# Patient Record
Sex: Male | Born: 1965
Health system: Southern US, Community
[De-identification: ages and names within clinical notes are randomized; demographics above are authoritative.]

## PROBLEM LIST (undated history)

## (undated) DIAGNOSIS — F32A Depression, unspecified: Secondary | ICD-10-CM

## (undated) DIAGNOSIS — F329 Major depressive disorder, single episode, unspecified: Secondary | ICD-10-CM

## (undated) DIAGNOSIS — F1910 Other psychoactive substance abuse, uncomplicated: Secondary | ICD-10-CM

## (undated) DIAGNOSIS — E785 Hyperlipidemia, unspecified: Secondary | ICD-10-CM

## (undated) DIAGNOSIS — F419 Anxiety disorder, unspecified: Secondary | ICD-10-CM

## (undated) HISTORY — DX: Hyperlipidemia, unspecified: E78.5

## (undated) HISTORY — PX: APPENDECTOMY: SHX54

## (undated) HISTORY — PX: EYE SURGERY: SHX253

---

## 1993-07-19 DIAGNOSIS — F1910 Other psychoactive substance abuse, uncomplicated: Secondary | ICD-10-CM

## 1993-07-19 HISTORY — DX: Other psychoactive substance abuse, uncomplicated: F19.10

## 2009-09-22 ENCOUNTER — Ambulatory Visit (HOSPITAL_COMMUNITY): Admission: RE | Admit: 2009-09-22 | Discharge: 2009-09-22 | Payer: Self-pay | Admitting: Orthopedic Surgery

## 2010-07-24 ENCOUNTER — Ambulatory Visit (HOSPITAL_COMMUNITY)
Admission: RE | Admit: 2010-07-24 | Discharge: 2010-07-24 | Payer: Self-pay | Source: Home / Self Care | Attending: Orthopedic Surgery | Admitting: Orthopedic Surgery

## 2011-06-02 DIAGNOSIS — R079 Chest pain, unspecified: Secondary | ICD-10-CM

## 2011-07-05 ENCOUNTER — Emergency Department (HOSPITAL_COMMUNITY)
Admission: EM | Admit: 2011-07-05 | Discharge: 2011-07-06 | Disposition: A | Discharge status: Home / Self Care | Payer: PRIVATE HEALTH INSURANCE | Attending: Emergency Medicine | Admitting: Emergency Medicine

## 2011-07-05 ENCOUNTER — Encounter: Payer: Self-pay | Admitting: Emergency Medicine

## 2011-07-05 DIAGNOSIS — F17200 Nicotine dependence, unspecified, uncomplicated: Secondary | ICD-10-CM | POA: Insufficient documentation

## 2011-07-05 DIAGNOSIS — F411 Generalized anxiety disorder: Secondary | ICD-10-CM

## 2011-07-05 DIAGNOSIS — F419 Anxiety disorder, unspecified: Secondary | ICD-10-CM

## 2011-07-05 HISTORY — DX: Major depressive disorder, single episode, unspecified: F32.9

## 2011-07-05 HISTORY — DX: Other psychoactive substance abuse, uncomplicated: F19.10

## 2011-07-05 HISTORY — DX: Anxiety disorder, unspecified: F41.9

## 2011-07-05 HISTORY — DX: Depression, unspecified: F32.A

## 2011-07-05 LAB — CBC
MCHC: 35.5 g/dL (ref 30.0–36.0)
MCV: 85.9 fL (ref 78.0–100.0)
Platelets: 332 10*3/uL (ref 150–400)

## 2011-07-05 LAB — URINALYSIS, ROUTINE W REFLEX MICROSCOPIC
Bilirubin Urine: NEGATIVE
Ketones, ur: NEGATIVE mg/dL
Leukocytes, UA: NEGATIVE
Urobilinogen, UA: 0.2 mg/dL (ref 0.0–1.0)

## 2011-07-05 LAB — COMPREHENSIVE METABOLIC PANEL
Albumin: 4.3 g/dL (ref 3.5–5.2)
Alkaline Phosphatase: 70 U/L (ref 39–117)
BUN: 12 mg/dL (ref 6–23)
Calcium: 10 mg/dL (ref 8.4–10.5)
GFR calc Af Amer: >90 mL/min (ref >90)
GFR calc non Af Amer: >90 mL/min (ref >90)
Glucose, Bld: 105 mg/dL — ABNORMAL HIGH (ref 70–99)
Potassium: 3.6 mEq/L (ref 3.5–5.1)
Total Bilirubin: 0.3 mg/dL (ref 0.3–1.2)
Total Protein: 7.4 g/dL (ref 6.0–8.3)

## 2011-07-05 LAB — DIFFERENTIAL
Basophils Relative: 0 % (ref 0–1)
Lymphs Abs: 1.9 10*3/uL (ref 0.7–4.0)
Monocytes Absolute: 0.6 10*3/uL (ref 0.1–1.0)
Neutro Abs: 8.8 10*3/uL — ABNORMAL HIGH (ref 1.7–7.7)
Neutrophils Relative %: 77 % (ref 43–77)

## 2011-07-05 LAB — ETHANOL: Alcohol, Ethyl (B): <11 mg/dL (ref 0–11)

## 2011-07-05 LAB — RAPID URINE DRUG SCREEN, HOSP PERFORMED
Benzodiazepines: NOT DETECTED
Tetrahydrocannabinol: NOT DETECTED

## 2011-07-05 MED ORDER — NICOTINE 7 MG/24HR TD PT24
7.0000 mg | MEDICATED_PATCH | Freq: Once | TRANSDERMAL | Status: AC
  Administered 2011-07-05: 7 mg via TRANSDERMAL
  Filled 2011-07-05: qty 1

## 2011-07-05 MED ORDER — LORAZEPAM 1 MG PO TABS
1.0000 mg | ORAL_TABLET | Freq: Once | ORAL | Status: AC
  Administered 2011-07-05: 1 mg via ORAL
  Filled 2011-07-05: qty 1

## 2011-07-05 MED ORDER — QUETIAPINE FUMARATE 50 MG PO TABS
50.0000 mg | ORAL_TABLET | Freq: Every day | ORAL | Status: DC
  Administered 2011-07-05: 50 mg via ORAL
  Filled 2011-07-05: qty 1

## 2011-07-05 MED ORDER — QUETIAPINE FUMARATE 50 MG PO TABS
50.0000 mg | ORAL_TABLET | Freq: Once | ORAL | Status: AC
  Administered 2011-07-05: 50 mg via ORAL
  Filled 2011-07-05: qty 1

## 2011-07-05 MED ORDER — TRAZODONE HCL 100 MG PO TABS
100.0000 mg | ORAL_TABLET | Freq: Every day | ORAL | Status: DC
  Administered 2011-07-05: 100 mg via ORAL
  Filled 2011-07-05: qty 1

## 2011-07-05 MED ORDER — QUETIAPINE FUMARATE 50 MG PO TABS: 50.0000 mg | ORAL_TABLET | Freq: Every day | ORAL | Status: DC

## 2011-07-05 NOTE — ED Notes (Signed)
Two patient belonging bags locked in activity room. 

## 2011-07-05 NOTE — ED Notes (Signed)
PA prescribed Buspar and Xanax in October. Pt also seen by [facility] hospital November for anxiety.  Pt also seen by MD at [facility] and was taken off xanax and changed to Zoloft on Thursday.  Pt reports anxiety improved from October. Still getting sporadic panic attacks and still has difficulty sleeping and trouble eating for over a week. Pt is supposed to see a counselor today. Patient denies SI/HI. Pt says he did not wait for counseling today as something is "wrong."

## 2011-07-05 NOTE — ED Notes (Signed)
Gave patients truck keys to his brother with patients permission.

## 2011-07-05 NOTE — ED Notes (Signed)
Pt c/o dealing with anxiety x 1 week, unable to sleep has been seen and put on buspar and then changed to xanax. And has now started zoloft.  Pt states still not sleeping.

## 2011-07-05 NOTE — ED Provider Notes (Signed)
Medical screening examination/treatment/procedure(s) were performed by non-physician practitioner and as supervising physician I was immediately available for consultation/collaboration.   Grover Woodfield, MD 07/05/11 1511 

## 2011-07-05 NOTE — ED Provider Notes (Signed)
History    this is a generally healthy 45 year old male presenting to the ED with a chief complaint of anxiety. Patient states for the past 2 months he has been having increased stress anxiety. He has trouble sleeping, had been pacing around, and has no appetite. He has multiple panic attacks.  Patient attributed his symptoms to his current job. Patient states, since starting a new job earlier this year he has been having to performed many different task. 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 He denies history of depression, he states he has good family support. He denies having headache, fever, chest pain, shortness of breath, abdominal pain, nausea, vomiting, diarrhea, dysuria. He has been evaluated by his primary care Dr. and has been on several different medications for it.  Initially he was taking BuSpar for 1 month, with no relief in symptoms. He then transitioned to Xanax but just recently discontinued due to his doctor's recommendation. His last Xanax was yesterday. He is currently taking Zoloft and Ambien. He continues to endorse a sensation of anxiety and trouble sleeping. Although he denies homicidal or suicidal thoughts, he is afraid that it may he needs up to it if he doesn't have help. He denies recreational drug use.  CSN: [id number] Arrival date & time: 07/05/2011  7:40 AM   First MD Initiated Contact with Patient 07/05/11 0809      Chief Complaint  Patient presents with  . Anxiety    (Consider location/radiation/quality/duration/timing/severity/associated sxs/prior treatment) HPI  Past Medical History  Diagnosis Date  . Depression   . Drug abuse     Past Surgical History  Procedure Date  . Appendectomy     History reviewed. No pertinent family history.  History  Substance Use Topics  . Smoking status: Current Everyday Smoker -- 1.5 packs/day    Types: Cigarettes  . Smokeless tobacco: Not on file  . Alcohol Use: Yes      Review of Systems    All other systems reviewed and are negative.    Allergies  Review of patient's allergies indicates no known allergies.  Home Medications   Current Outpatient Rx  Name Route Sig Dispense Refill  . VITAMIN B 12 PO Oral Take 1 tablet by mouth daily.      . IBUPROFEN 200 MG PO TABS Oral Take 400 mg by mouth every 6 (six) hours as needed. pain     . SERTRALINE HCL 50 MG PO TABS Oral Take 25 mg by mouth daily. Pt takes 1/2 tab for 50mg  dose     . ZOLPIDEM TARTRATE 10 MG PO TABS Oral Take 10 mg by mouth at bedtime.        BP 135/86  Pulse 94  Temp(Src) 98 F (36.7 C) (Oral)  Resp 16  SpO2 97%  Physical Exam  Nursing note and vitals reviewed. Constitutional: He appears well-developed and well-nourished.       Awake, alert, nontoxic appearance  HENT:  Head: Atraumatic.  Mouth/Throat: Oropharynx is clear and moist.  Eyes: EOM are normal. Pupils are equal, round, and reactive to light. Right eye exhibits no discharge. Left eye exhibits no discharge.  Neck: Neck supple.  Cardiovascular: Normal rate and regular rhythm.   Pulmonary/Chest: Effort normal. He exhibits no tenderness.  Abdominal: Soft. Bowel sounds are normal. There is no tenderness. There is no rebound.  Musculoskeletal: He exhibits no tenderness.       Baseline ROM, no obvious new focal weakness  Neurological:  Mental status and motor strength appears baseline for patient and situation  Skin: No rash noted.  Psychiatric: He has a normal mood and affect.    ED Course  Procedures (including critical care time)  Labs Reviewed - No data to display No results found.   No diagnosis found.    MDM  This is a generally healthy patient, presenting with increased anxiety. This is likely related to a stressful job. I doubt a medical correlation to his symptoms. He may benefit from further outpatient management, including thyroid and cortisol check, which is not normally done in the ED. Patient requests for further  management, and he is worried of hurting himself if he does get discharge, therefore i will consult with Behavioral Health for further management.     9:36 AM I have consulted with Behavioral Health ACT Team, who agrees to see pt in ED.  Pt currently resting comfortably after administration of ativan 1mg  PO.  Pt is medically cleared.  He will benefit for outpatient follow up.    Fayrene Helper, PA 07/05/11 1339

## 2011-07-05 NOTE — Consult Note (Signed)
Patient Identification:  Benjamin Zamora Date of Evaluation:  07/05/2011   History of Present Illness:  45 year old male presenting to the ED with a chief complaint of anxiety. Patient states for the past 2 months he has been having increased stress anxiety. He has trouble sleeping, had been pacing around, and has no appetite. He has multiple panic attacks. Patient attributed his symptoms to his current job. Patient states, since starting a new job earlier this year he has been having to performed many different task. 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 He denies history of depression, he states he has good family support. He denies having headache, fever, chest pain, shortness of breath, abdominal pain, nausea, vomiting, diarrhea, dysuria. He has been evaluated by his primary care Dr. and has been on several different medications for it. Initially he was taking BuSpar for 1 month, with no relief in symptoms. He then transitioned to Xanax but just recently discontinued due to his doctor's recommendation. His last Xanax was yesterday. He is currently taking Zoloft and Ambien. He continues to endorse a sensation of anxiety and trouble sleeping. Although he denies homicidal or suicidal thoughts, he is afraid that it may he needs up to it if he doesn't have help. He denies recreational drug use.   Patient has to take a number of medications in the past like Xanax Ambien Vistaril temazepam but nothing has helped him up to now. I discussed with him about Seroquel that it will help him both for insomnia and anxiety patient agreed to take this medication. Side effects and benefits of the medications discussed with the patient.   Mental Status Examination: She is logical and goal-directed during interview not hallucinating or delusional. Not suicidal or homicidal. Alert awake oriented x3 insight and judgment fair attention concentration good memory good   Past Medical History:       Past Medical History  Diagnosis Date  . Depression   . Drug abuse        Past Surgical History  Procedure Date  . Appendectomy     Filed Vitals:   07/05/11 1327  BP: 132/83  Pulse: 98  Temp: 98.2 F (36.8 C)  Resp: 18    Lab Results:   BMET    Component Value Date/Time   NA 137 07/05/2011 0915   K 3.6 07/05/2011 0915   CL 101 07/05/2011 0915   CO2 26 07/05/2011 0915   GLUCOSE 105* 07/05/2011 0915   BUN 12 07/05/2011 0915   CREATININE 0.84 07/05/2011 0915   CALCIUM 10.0 07/05/2011 0915   GFRNONAA >90 07/05/2011 0915   GFRAA >90 07/05/2011 0915    Allergies: No Known Allergies  Current Medications:  Prior to Admission medications   Medication Sig Start Date End Date Taking? Authorizing Provider  Cyanocobalamin (VITAMIN B 12 PO) Take 1 tablet by mouth daily.     Yes Historical Provider, MD  ibuprofen (ADVIL,MOTRIN) 200 MG tablet Take 400 mg by mouth every 6 (six) hours as needed. pain    Yes Historical Provider, MD  sertraline (ZOLOFT) 50 MG tablet Take 25 mg by mouth daily. Pt takes 1/2 tab for 50mg  dose    Yes Historical Provider, MD  zolpidem (AMBIEN) 10 MG tablet Take 10 mg by mouth at bedtime.     Yes Historical Provider, MD    Social History:    reports that he has been smoking Cigarettes.  He has been smoking about 1.5 packs per day. He does not  have any smokeless tobacco history on file. He reports that he drinks alcohol. His drug history not on file.   Family History:    History reviewed. No pertinent family history.   DIAGNOSIS:   AXIS I  panic disorder, depressive disorder NOS   AXIS II  Deffered  AXIS III See medical notes.  AXIS IV  stress at job unable to sleep   AXIS V 50     Recommendations:  Patient started on Seroquel and trazodone and will be observed in the ER.   Eulogio Ditch, MD

## 2011-07-05 NOTE — ED Notes (Signed)
Denies si/hi at this time 

## 2011-07-05 NOTE — ED Notes (Signed)
Patient resting quietly.  sts that he dosen't need anything except a drink of water, that was brought to him

## 2011-07-05 NOTE — BH Assessment (Signed)
Assessment Note   Benjamin Zamora is an 45 y.o. male. Pt here today with a complaint of insomnia, decreased appetite, and  anxiety.  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 Pt is married and has three kids. Pt has a  4 yrs old unemployed daughter expecting a baby and feels that he will have to take care of this baby. Pt attempted to use exercise and listen to music to decreased his stress level, but nothing improves. Pt reports his biggest stressor is that he is going to get sick from not sleeping or not eating and scared that something bad could happen if he could not get help for his anxiety.Sts he has not slept in approx. 1 week. He denies SI, HI, and AVH. However, he sts "I'm scared that If I don't sleep for one more night.Marland KitchenMarland KitchenI may start feeling depressed or suicidal". Pt insures that he is in no way suicidal at this time. Pt denies alcohol and drug use. He admits that approx. 15 yrs ago he was admitted at [facility] for [facility] use and depression.    Axis I: Anxiety Disorder NOS Axis II: Deferred Axis III: Insomnia Past Medical History  Diagnosis Date  . Depression   . Drug abuse   . Anxiety    Axis IV: occupational problems, other psychosocial or environmental problems and problems related to social environment Axis V: 41-50 serious symptoms  Past Medical History:  Past Medical History  Diagnosis Date  . Depression   . Drug abuse   . Anxiety     Past Surgical History  Procedure Date  . Appendectomy     Family History: History reviewed. No pertinent family history.  Social History:  reports that he has been smoking Cigarettes.  He has been smoking about 1.5 packs per day. He does not have any smokeless tobacco history on file. He reports that he drinks alcohol. His drug history not on file.  Additional Social History:  Alcohol / Drug Use Pain Medications: none reported Prescriptions:  Cyanocobalamin (Vitamin B-12 PO), Ibuprofen (Advil,motrin), Setraline (Zoloft), Zopidem (ambien) Over the Counter: none reported History of alcohol / drug use?: Yes Longest period of sobriety (when/how long): 15 yrs of sobriety from [facility], per patient Substance #1 Name of Substance 1: THC 1 - Age of First Use: n/a 1 - Amount (size/oz): n/a 1 - Frequency: n/a 1 - Duration: n/a 1 - Last Use / Amount: 15 yrs ago Allergies: No Known Allergies  Home Medications:  Medications Prior to Admission  Medication Dose Route Frequency Provider Last Rate Last Dose  . LORazepam (ATIVAN) tablet 1 mg  1 mg Oral Once Fayrene Helper, PA   1 mg at 07/05/11 0913  . nicotine (NICODERM CQ - dosed in mg/24 hr) patch 7 mg  7 mg Transdermal Once Fayrene Helper, PA   7 mg at 07/05/11 0948  . QUEtiapine (SEROQUEL) tablet 50 mg  50 mg Oral Once Katheren Puller, MD   50 mg at 07/05/11 1506  . QUEtiapine (SEROQUEL) tablet 50 mg  50 mg Oral QHS Shamsher S Ahluwalia, MD      . traZODone (DESYREL) tablet 100 mg  100 mg Oral QHS Shamsher Alvie Heidelberg, MD      . DISCONTD: QUEtiapine (SEROQUEL) tablet 50 mg  50 mg Oral QHS Katheren Puller, MD       No current outpatient prescriptions on file as of 07/05/2011.  OB/GYN Status:  No LMP for male patient.  General Assessment Data Location of Assessment: [facility] ED ACT Assessment:  (none reported) Living Arrangements: Spouse/significant other;Children (3 children in home (ages 82,15,21)) Can pt return to current living arrangement?: Yes Admission Status: Voluntary Is patient capable of signing voluntary admission?: Yes Transfer from: Acute Hospital Referral Source: Self/Family/Friend  Education Status Is patient currently in school?: No Current Grade:  (n/a) Highest grade of school patient has completed:  (n/a) Name of school: n/a Contact person: n/a  Risk to self Suicidal Ideation: No Suicidal Intent: No Is patient at risk for suicide?: No Suicidal Plan?:  No Access to Means: No What has been your use of drugs/alcohol within the last 12 months?:  (n/a) Previous Attempts/Gestures: No How many times?:  (n/a) Other Self Harm Risks:  (n/a) Triggers for Past Attempts: Other (Comment) (n/a) Intentional Self Injurious Behavior: None Family Suicide History: No Recent stressful life event(s): Other (Comment) (new/demanding job,3kids,21 y/o child preg.,not able to sleep) Persecutory voices/beliefs?: No Depression: Yes Depression Symptoms: Insomnia Substance abuse history and/or treatment for substance abuse?: No Suicide prevention information given to non-admitted patients: Not applicable  Risk to Others Homicidal Ideation: No Thoughts of Harm to Others: No Current Homicidal Intent: No Current Homicidal Plan: No Access to Homicidal Means: No Identified Victim:  (n/a) History of harm to others?: No Assessment of Violence: None Noted Violent Behavior Description:  (none noted) Does patient have access to weapons?: No Criminal Charges Pending?: No Does patient have a court date: No  Psychosis Hallucinations: None noted Delusions: None noted  Mental Status Report Appear/Hygiene: Other (Comment) (neat) Eye Contact: Good Motor Activity: Other (Comment);Unremarkable (normal) Speech: Logical/coherent Level of Consciousness: Alert Mood: Depressed;Anxious;Helpless Affect: Unable to Assess;Anxious Anxiety Level: Severe Thought Processes: Relevant Judgement: Unimpaired Orientation: Person;Time;Place;Situation Obsessive Compulsive Thoughts/Behaviors: None  Cognitive Functioning Concentration: Normal Memory: Recent Intact;Remote Intact IQ: Average Insight: Poor Impulse Control: Good Appetite: Poor Weight Loss:  (no wt. loss reported) Weight Gain:  (no wt. gain reported) Sleep: Decreased Total Hours of Sleep:  (0) Vegetative Symptoms: None  Prior Inpatient Therapy Prior Inpatient Therapy: Yes Prior Therapy Dates:  (15 yrs  ago) Prior Therapy Facilty/Provider(s):  [facility]) Reason for Treatment:  (depression and substance abuse treatment)  Prior Outpatient Therapy Prior Outpatient Therapy: No Prior Therapy Dates:  (going to see psychiatrist for 1rst x but came to ED today) Prior Therapy Facilty/Provider(s):  (no hx treatment) Reason for Treatment:  (no prior treatment)  ADL Screening (condition at time of admission) Independently performs ADLs?: No Communication: Independent Dressing (OT): Independent Grooming: Independent Feeding: Independent Bathing: Independent Toileting: Independent In/Out Bed: Independent Walks in Home: Independent  Home Assistive Devices/Equipment Home Assistive Devices/Equipment: None      Values / Beliefs Cultural Requests During Hospitalization: None Spiritual Requests During Hospitalization: None   Advance Directives (For Healthcare) Advance Directive: Patient does not have advance directive Nutrition Screen Diet: Regular Unintentional weight loss greater than 10lbs within the last month: No (no wt. loss, however; pt reports poor appetite) Dysphagia: No Home Tube Feeding or Total Parenteral Nutrition (TPN): No Patient appears severely malnourished: No Pregnant or Lactating: No Dietitian Consult Needed: No  Additional Information 1:1 In Past 12 Months?: No CIRT Risk: No Elopement Risk: No Does patient have medical clearance?: Yes     Disposition:  Disposition Disposition of Patient: Other dispositions (remain in the ED for observation & med mgt;re-assess in am) Other disposition(s): Information only  Per recommendations from Dr. Rogers Blocker: Patient started on Seroquel and  trazodone and will be observed in the ER.   On Site Evaluation by:   Reviewed with Physician:     Melynda Ripple [facility] 07/05/2011 3:55 PM

## 2011-07-05 NOTE — ED Notes (Signed)
2 personal belongings bags locked up in the Equipment Room on TCU.

## 2011-07-05 NOTE — ED Notes (Signed)
Patient changed into paper scrubs and security called to wand

## 2011-07-05 NOTE — ED Notes (Signed)
Pt reports trigger has been starting a new job that has increased responsibility and stress level. 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 Married and has three kids so added responsibility there as well.  Pt also has a child expecting a child that was unexpected and an additional stressor in his life.   Pt attempted to use exercise and listen to music to decreased his stress level, but nothing improves. Pt reports his biggest stressor is that he is going to get sick from not sleeping or not eating and scared that something bad could happen if he could not get help for his anxiety.

## 2011-07-05 NOTE — ED Notes (Signed)
Pt reports he does not feel comfortable going home and says "something is wrong, I just know something is wrong."

## 2011-07-05 NOTE — ED Notes (Signed)
Pt has history of drug addiction 15 years ago. Pt concerned the xanax would trigger his addiction again. Pt reports being clean for 15 years.

## 2011-07-06 MED ORDER — TRAZODONE HCL 100 MG PO TABS: 100.0000 mg | ORAL_TABLET | Freq: Every day | ORAL | Status: AC

## 2011-07-06 MED ORDER — QUETIAPINE FUMARATE 50 MG PO TABS: 50.0000 mg | ORAL_TABLET | Freq: Every day | ORAL | Status: AC

## 2011-07-06 NOTE — ED Notes (Signed)
Slept soundly through the night, no c/o voiced

## 2011-07-06 NOTE — Consult Note (Signed)
  Benjamin Zamora is a 45 y.o. male [id number] March 01, 1966  Subjective/Objective:  Patient is doing much better. He slept good last night. No side effects reported from the medications. Patient is not suicidal or homicidal not hallucinating or delusional alert to recurrent at x3 insight and judgment intact patient has a good family support and is motivated to followup in the outpatient setting   Filed Vitals:   07/06/11 0615  BP: 126/88  Pulse: 80  Temp: 98.3 F (36.8 C)  Resp: 18    Lab Results:   BMET    Component Value Date/Time   NA 137 07/05/2011 0915   K 3.6 07/05/2011 0915   CL 101 07/05/2011 0915   CO2 26 07/05/2011 0915   GLUCOSE 105* 07/05/2011 0915   BUN 12 07/05/2011 0915   CREATININE 0.84 07/05/2011 0915   CALCIUM 10.0 07/05/2011 0915   GFRNONAA >90 07/05/2011 0915   GFRAA >90 07/05/2011 0915    Medications:  Scheduled:     . nicotine  7 mg Transdermal Once  . QUEtiapine  50 mg Oral Once  . QUEtiapine  50 mg Oral QHS  . traZODone  100 mg Oral QHS  . DISCONTD: QUEtiapine  50 mg Oral QHS     PRN Meds   Assessment/Plan:  Patient can be discharged I gave him a one-month supply of medications. AHLUWALIA,SHAMSHER S MD 07/06/2011

## 2011-07-06 NOTE — ED Notes (Signed)
Amended note--Pt instructed to stop zoloft and ambien VORB Dr Laymond Purser

## 2011-07-06 NOTE — ED Notes (Signed)
Belongings returned

## 2013-02-12 ENCOUNTER — Encounter: Payer: Self-pay | Admitting: *Deleted

## 2013-02-12 NOTE — Telephone Encounter (Signed)
This encounter was created in error - please disregard.

## 2013-02-13 ENCOUNTER — Encounter: Payer: Self-pay | Admitting: Family Medicine

## 2013-02-13 ENCOUNTER — Ambulatory Visit (INDEPENDENT_AMBULATORY_CARE_PROVIDER_SITE_OTHER): Payer: BC Managed Care – PPO | Admitting: Family Medicine

## 2013-02-13 VITALS — BP 132/88 | HR 72 | Temp 97.6°F | Wt 185.0 lb

## 2013-02-13 DIAGNOSIS — F3289 Other specified depressive episodes: Secondary | ICD-10-CM

## 2013-02-13 DIAGNOSIS — F32A Depression, unspecified: Secondary | ICD-10-CM

## 2013-02-13 DIAGNOSIS — G4700 Insomnia, unspecified: Secondary | ICD-10-CM

## 2013-02-13 DIAGNOSIS — F329 Major depressive disorder, single episode, unspecified: Secondary | ICD-10-CM

## 2013-02-13 MED ORDER — ESCITALOPRAM OXALATE 10 MG PO TABS: 10.0000 mg | ORAL_TABLET | Freq: Every day | ORAL | Status: DC

## 2013-02-13 MED ORDER — SILDENAFIL CITRATE 100 MG PO TABS: 100.0000 mg | ORAL_TABLET | Freq: Every day | ORAL | Status: DC | PRN

## 2013-02-13 MED ORDER — CLONAZEPAM 1 MG PO TABS: 1.0000 mg | ORAL_TABLET | Freq: Two times a day (BID) | ORAL | Status: DC | PRN

## 2013-02-13 NOTE — Patient Instructions (Signed)

## 2013-02-13 NOTE — Progress Notes (Signed)
  Subjective:    Patient ID: Benjamin Zamora, male    DOB: Oct 07, 1965, 47 y.o.   MRN: [id number]  HPI This 47 y.o. male presents for evaluation of depression and insomnia.  He has Hx of depression and takes lexapro which helps.  He has hx of  Insomnia and Clonazepam helps very well.  He denies any SI/HI and is very busy with his Job with [employer].    Review of Systems Vital signs noted  Well developed well nourished male.  HEENT - Head atraumatic Normocephalic                Eyes - PERRLA, Conjuctiva - clear Sclera- Clear EOMI                Ears - EAC's Wnl TM's Wnl Gross Hearing WNL                Nose - Nares patent                 Throat - oropharanx wnl Respiratory - Lungs CTA bilateral Cardiac - RRR S1 and S2 without murmur GI - Abdomen soft Nontender and bowel sounds active x 4 Extremities - No edema. Neuro - Grossly intact.    Objective:   Physical Exam Vital signs noted  Well developed well nourished male.  HEENT - Head atraumatic Normocephalic                Eyes - PERRLA, Conjuctiva - clear Sclera- Clear EOMI                Ears - EAC's Wnl TM's Wnl Gross Hearing WNL                Nose - Nares patent                 Throat - oropharanx wnl Respiratory - Lungs CTA bilateral Cardiac - RRR S1 and S2 without murmur GI - Abdomen soft Nontender and bowel sounds active x 4 Extremities - No edema. Neuro - Grossly intact.       Assessment & Plan:  Depression - Plan: escitalopram (LEXAPRO) 10 MG tablet, clonazePAM (KLONOPIN) 1 MG tablet  Insomnia - Discussed sleep hygiene and discussed taking holiday with clonazepam to help it work Better for insomnia tx.

## 2013-05-25 ENCOUNTER — Ambulatory Visit (INDEPENDENT_AMBULATORY_CARE_PROVIDER_SITE_OTHER): Payer: BC Managed Care – PPO | Admitting: Family Medicine

## 2013-05-25 ENCOUNTER — Ambulatory Visit (INDEPENDENT_AMBULATORY_CARE_PROVIDER_SITE_OTHER): Payer: BC Managed Care – PPO

## 2013-05-25 ENCOUNTER — Encounter: Payer: Self-pay | Admitting: Family Medicine

## 2013-05-25 VITALS — BP 145/91 | HR 90 | Temp 96.9°F | Ht 72.0 in | Wt 187.0 lb

## 2013-05-25 DIAGNOSIS — M25562 Pain in left knee: Secondary | ICD-10-CM

## 2013-05-25 DIAGNOSIS — F32A Depression, unspecified: Secondary | ICD-10-CM

## 2013-05-25 DIAGNOSIS — F3289 Other specified depressive episodes: Secondary | ICD-10-CM

## 2013-05-25 DIAGNOSIS — M25569 Pain in unspecified knee: Secondary | ICD-10-CM

## 2013-05-25 DIAGNOSIS — F329 Major depressive disorder, single episode, unspecified: Secondary | ICD-10-CM

## 2013-05-25 MED ORDER — IBUPROFEN 600 MG PO TABS: 600.0000 mg | ORAL_TABLET | Freq: Three times a day (TID) | ORAL | Status: DC | PRN

## 2013-05-25 MED ORDER — CLONAZEPAM 1 MG PO TABS: 1.0000 mg | ORAL_TABLET | Freq: Two times a day (BID) | ORAL | Status: DC | PRN

## 2013-05-25 MED ORDER — HYDROCODONE-ACETAMINOPHEN 5-325 MG PO TABS: 1.0000 | ORAL_TABLET | Freq: Four times a day (QID) | ORAL | Status: DC | PRN

## 2013-05-25 NOTE — Progress Notes (Signed)
  Subjective:    Patient ID: Benjamin Zamora, male    DOB: 12-May-1966, 47 y.o.   MRN: [id number]  HPI This 47 y.o. male presents for evaluation of knee pian for 2 months.  He has been Having left back that has been radiating down his left leg.  He has been having leg Pain at night and it has been severe.  He has been using icy hot and it still bothers Him..   Review of Systems C/o right knee pain and anxiety No chest pain, SOB, HA, dizziness, vision change, N/V, diarrhea, constipation, dysuria, urinary urge or rash.     Objective:   Physical Exam Vital signs noted  Well developed well nourished male.  HEENT - Head atraumatic Normocephalic                Eyes - PERRLA, Conjuctiva - clear Sclera- Clear EOMI                Ears - EAC's Wnl TM's Wnl Gross Hearing WNL                Nose - Nares patent                 Throat - oropharanx wnl Respiratory - Lungs CTA bilateral Cardiac - RRR S1 and S2 without murmur GI - Abdomen soft Nontender and bowel sounds active x 4 Extremities - No edema. Neuro - Grossly intact. MS - TTP right knee.        Assessment & Plan:  Knee pain, acute, left - Plan: DG Knee 1-2 Views Left, ibuprofen (ADVIL,MOTRIN) 600 MG tablet, HYDROcodone-acetaminophen (NORCO) 5-325 MG per tablet  Depression - Plan: clonazePAM (KLONOPIN) 1 MG tablet, DISCONTINUED: clonazePAM (KLONOPIN) 1 MG tablet  Follow up in 3 months and prn.  Deatra Canter FNP

## 2013-05-25 NOTE — Patient Instructions (Signed)
Patellofemoral Pain  Your exam shows your knee pain is probably due to a problem with the knee cap, the patella. This problem is also called patellofemoral pain, runner's knee, or chondromalacia. Most of the time, this problem is due to overuse of the knee joint. Repeated bending and straightening can irritate the underside of the knee cap. When this happens, activities such as running, walking, climbing, biking or jumping usually produce pain. Pain may also occur after prolonged sitting. Other patellofemoral symptoms can include joint stiffness, swelling, and a snapping or grinding sensation with movement. Rest and rehabilitation are usually successful in treating this problem. Surgery is rarely needed.  Treatment includes correcting any mechanical factors that could hurt the normal working of the knee. This could be weak thigh muscles or foot problems. Avoid repetitive activities of the knee until the pain and other symptoms improve. Apply ice packs over the knee for 20 to 30 minutes every 2 to 4 hours to reduce pain and swelling. Only take over-the-counter or prescription medicines for pain, discomfort, or fever as directed by your caregiver. Knee braces or neoprene sleeves may help reduce irritation. Rehabilitation exercises to strengthen the quad muscle are often prescribed when your symptoms are better. Call your caregiver for a follow-up exam to evaluate your response to treatment.  Document Released: 08/12/2004 Document Revised: 09/27/2011 Document Reviewed: 07/05/2005  ExitCare® Patient Information ©2014 [employer].

## 2013-06-18 ENCOUNTER — Ambulatory Visit: Payer: BC Managed Care – PPO | Admitting: Family Medicine

## 2013-12-13 ENCOUNTER — Ambulatory Visit (INDEPENDENT_AMBULATORY_CARE_PROVIDER_SITE_OTHER): Payer: BC Managed Care – PPO | Admitting: Family Medicine

## 2013-12-13 VITALS — BP 117/79 | HR 85 | Temp 98.6°F | Wt 187.4 lb

## 2013-12-13 DIAGNOSIS — F329 Major depressive disorder, single episode, unspecified: Secondary | ICD-10-CM

## 2013-12-13 DIAGNOSIS — F32A Depression, unspecified: Secondary | ICD-10-CM

## 2013-12-13 DIAGNOSIS — J329 Chronic sinusitis, unspecified: Secondary | ICD-10-CM

## 2013-12-13 DIAGNOSIS — N529 Male erectile dysfunction, unspecified: Secondary | ICD-10-CM

## 2013-12-13 DIAGNOSIS — F3289 Other specified depressive episodes: Secondary | ICD-10-CM

## 2013-12-13 MED ORDER — ESCITALOPRAM OXALATE 10 MG PO TABS: 10.0000 mg | ORAL_TABLET | Freq: Every day | ORAL | Status: DC

## 2013-12-13 MED ORDER — AMOXICILLIN 875 MG PO TABS: 875.0000 mg | ORAL_TABLET | Freq: Two times a day (BID) | ORAL | Status: DC

## 2013-12-13 MED ORDER — SILDENAFIL CITRATE 100 MG PO TABS: 100.0000 mg | ORAL_TABLET | Freq: Every day | ORAL | Status: DC | PRN

## 2013-12-13 MED ORDER — CLONAZEPAM 1 MG PO TABS: 1.0000 mg | ORAL_TABLET | Freq: Two times a day (BID) | ORAL | Status: DC | PRN

## 2013-12-13 NOTE — Progress Notes (Signed)
   Subjective:    Patient ID: Demondre Aguas, male    DOB: [birth year], 48 y.o.   MRN: [id number]  HPI  This 48 y.o. male presents for evaluation of uri sx's.  He needs refills on his depression and insomnia meds.  Review of Systems No chest pain, SOB, HA, dizziness, vision change, N/V, diarrhea, constipation, dysuria, urinary urgency or frequency, myalgias, arthralgias or rash.     Objective:   Physical Exam  Vital signs noted  Well developed well nourished male.  HEENT - Head atraumatic Normocephalic                Eyes - PERRLA, Conjuctiva - clear Sclera- Clear EOMI                Ears - EAC's Wnl TM's Wnl Gross Hearing WNL                Nose - Nares patent                 Throat - oropharanx wnl Respiratory - Lungs CTA bilateral Cardiac - RRR S1 and S2 without murmur GI - Abdomen soft Nontender and bowel sounds active x 4 Extremities - No edema. Neuro - Grossly intact.      Assessment & Plan:  Sinusitis - Plan: amoxicillin (AMOXIL) 875 MG tablet  Depression - Plan: clonazePAM (KLONOPIN) 1 MG tablet, escitalopram (LEXAPRO) 10 MG tablet  ED (erectile dysfunction) - Plan: sildenafil (VIAGRA) 100 MG tablet  Lysbeth Penner FNP

## 2014-07-10 ENCOUNTER — Other Ambulatory Visit: Payer: Self-pay | Admitting: Family Medicine

## 2014-07-10 NOTE — Telephone Encounter (Signed)
Last seen 12/13/13 [place]  If approved route to nurse to call into [facility]

## 2014-07-17 ENCOUNTER — Other Ambulatory Visit: Payer: Self-pay | Admitting: Family Medicine

## 2014-07-26 ENCOUNTER — Other Ambulatory Visit: Payer: Self-pay | Admitting: Family Medicine

## 2014-07-26 DIAGNOSIS — F32A Depression, unspecified: Secondary | ICD-10-CM

## 2014-07-26 DIAGNOSIS — F329 Major depressive disorder, single episode, unspecified: Secondary | ICD-10-CM

## 2014-07-26 MED ORDER — CLONAZEPAM 1 MG PO TABS: 1.0000 mg | ORAL_TABLET | Freq: Two times a day (BID) | ORAL | Status: DC | PRN

## 2014-12-02 ENCOUNTER — Other Ambulatory Visit: Payer: Self-pay | Admitting: Family Medicine

## 2014-12-11 ENCOUNTER — Encounter: Payer: Self-pay | Admitting: Family

## 2014-12-11 ENCOUNTER — Ambulatory Visit (INDEPENDENT_AMBULATORY_CARE_PROVIDER_SITE_OTHER): Payer: BLUE CROSS/BLUE SHIELD | Admitting: Family

## 2014-12-11 VITALS — BP 131/89 | HR 85 | Temp 98.3°F | Ht 72.0 in | Wt 187.0 lb

## 2014-12-11 DIAGNOSIS — F329 Major depressive disorder, single episode, unspecified: Secondary | ICD-10-CM

## 2014-12-11 DIAGNOSIS — F32A Depression, unspecified: Secondary | ICD-10-CM | POA: Insufficient documentation

## 2014-12-11 DIAGNOSIS — F411 Generalized anxiety disorder: Secondary | ICD-10-CM | POA: Diagnosis not present

## 2014-12-11 DIAGNOSIS — N529 Male erectile dysfunction, unspecified: Secondary | ICD-10-CM | POA: Diagnosis not present

## 2014-12-11 MED ORDER — SILDENAFIL CITRATE 100 MG PO TABS: 100.0000 mg | ORAL_TABLET | Freq: Every day | ORAL | Status: DC | PRN

## 2014-12-11 MED ORDER — ESCITALOPRAM OXALATE 10 MG PO TABS: 10.0000 mg | ORAL_TABLET | Freq: Every day | ORAL | Status: DC

## 2014-12-11 MED ORDER — CLONAZEPAM 1 MG PO TABS: 1.0000 mg | ORAL_TABLET | Freq: Two times a day (BID) | ORAL | Status: DC | PRN

## 2014-12-11 NOTE — Progress Notes (Signed)
   Subjective:    Patient ID: Benjamin Zamora, male    DOB: 07/05/1966, 48 y.o.   MRN: [id number]  Anxiety Presents for follow-up visit. Onset was 1 to 6 months ago. The problem has been waxing and waning. Patient reports no decreased concentration, depressed mood, excessive worry, insomnia, irritability, nervous/anxious behavior or palpitations. Symptoms occur occasionally. The symptoms are aggravated by family issues. The quality of sleep is good.   His past medical history is significant for anxiety/panic attacks and depression. Past treatments include SSRIs and benzodiazephines. The treatment provided significant relief. Compliance with prior treatments has been good.      Review of Systems  Constitutional: Negative.  Negative for irritability.  HENT: Negative.   Respiratory: Negative.   Cardiovascular: Negative.  Negative for palpitations.  Gastrointestinal: Negative.   Endocrine: Negative.   Genitourinary: Negative.   Musculoskeletal: Negative.   Neurological: Negative.   Hematological: Negative.   Psychiatric/Behavioral: Negative.  Negative for decreased concentration. The patient is not nervous/anxious and does not have insomnia.   All other systems reviewed and are negative.      Objective:   Physical Exam  Constitutional: He is oriented to person, place, and time. He appears well-developed and well-nourished. No distress.  HENT:  Head: Normocephalic.  Right Ear: External ear normal.  Left Ear: External ear normal.  Nose: Nose normal.  Mouth/Throat: Oropharynx is clear and moist.  Eyes: Pupils are equal, round, and reactive to light. Right eye exhibits no discharge. Left eye exhibits no discharge.  Neck: Normal range of motion. Neck supple. No thyromegaly present.  Cardiovascular: Normal rate, regular rhythm, normal heart sounds and intact distal pulses.   No murmur heard. Pulmonary/Chest: Effort normal and breath sounds normal. No respiratory distress. He has no  wheezes.  Abdominal: Soft. Bowel sounds are normal. He exhibits no distension. There is no tenderness.  Musculoskeletal: Normal range of motion. He exhibits no edema or tenderness.  Neurological: He is alert and oriented to person, place, and time. He has normal reflexes. No cranial nerve deficit.  Skin: Skin is warm and dry. No rash noted. No erythema.  Psychiatric: He has a normal mood and affect. His behavior is normal. Judgment and thought content normal.  Vitals reviewed.   BP 131/89 mmHg  Pulse 85  Temp(Src) 98.3 F (36.8 C) (Oral)  Ht 6' (1.829 m)  Wt 187 lb (84.823 kg)  BMI 25.36 kg/m2       Assessment & Plan:  1. GAD (generalized anxiety disorder) -Stress management discussed - escitalopram (LEXAPRO) 10 MG tablet; Take 1 tablet (10 mg total) by mouth daily.  Dispense: 90 tablet; Refill: 3 - clonazePAM (KLONOPIN) 1 MG tablet; Take 1 tablet (1 mg total) by mouth 2 (two) times daily as needed for anxiety.  Dispense: 30 tablet; Refill: 3 - CMP14+EGFR  2. Depression -Stress management discussed - escitalopram (LEXAPRO) 10 MG tablet; Take 1 tablet (10 mg total) by mouth daily.  Dispense: 90 tablet; Refill: 3 - clonazePAM (KLONOPIN) 1 MG tablet; Take 1 tablet (1 mg total) by mouth 2 (two) times daily as needed for anxiety.  Dispense: 30 tablet; Refill: 3 - CMP14+EGFR   Continue all meds Labs pending Health Maintenance reviewed Diet and exercise encouraged RTO 6 months   , FNP   

## 2014-12-11 NOTE — Patient Instructions (Signed)
Generalized Anxiety Disorder Generalized anxiety disorder (GAD) is a mental disorder. It interferes with life functions, including relationships, work, and school. GAD is different from normal anxiety, which everyone experiences at some point in their lives in response to specific life events and activities. Normal anxiety actually helps us prepare for and get through these life events and activities. Normal anxiety goes away after the event or activity is over.  GAD causes anxiety that is not necessarily related to specific events or activities. It also causes excess anxiety in proportion to specific events or activities. The anxiety associated with GAD is also difficult to control. GAD can vary from mild to severe. People with severe GAD can have intense waves of anxiety with physical symptoms (panic attacks).  SYMPTOMS The anxiety and worry associated with GAD are difficult to control. This anxiety and worry are related to many life events and activities and also occur more days than not for 6 months or longer. People with GAD also have three or more of the following symptoms (one or more in children):  Restlessness.   Fatigue.  Difficulty concentrating.   Irritability.  Muscle tension.  Difficulty sleeping or unsatisfying sleep. DIAGNOSIS GAD is diagnosed through an assessment by your health care provider. Your health care provider will ask you questions aboutyour mood,physical symptoms, and events in your life. Your health care provider may ask you about your medical history and use of alcohol or drugs, including prescription medicines. Your health care provider may also do a physical exam and blood tests. Certain medical conditions and the use of certain substances can cause symptoms similar to those associated with GAD. Your health care provider may refer you to a mental health specialist for further evaluation. TREATMENT The following therapies are usually used to treat GAD:    Medication. Antidepressant medication usually is prescribed for long-term daily control. Antianxiety medicines may be added in severe cases, especially when panic attacks occur.   Talk therapy (psychotherapy). Certain types of talk therapy can be helpful in treating GAD by providing support, education, and guidance. A form of talk therapy called cognitive behavioral therapy can teach you healthy ways to think about and react to daily life events and activities.  Stress managementtechniques. These include yoga, meditation, and exercise and can be very helpful when they are practiced regularly. A mental health specialist can help determine which treatment is best for you. Some people see improvement with one therapy. However, other people require a combination of therapies. Document Released: 10/30/2012 Document Revised: 11/19/2013 Document Reviewed: 10/30/2012 ExitCare Patient Information 2015 [employer]. This information is not intended to replace advice given to you by your health care provider. Make sure you discuss any questions you have with your health care provider.  

## 2014-12-12 LAB — CMP14+EGFR
ALBUMIN: 4.3 g/dL (ref 3.5–5.5)
ALT: 15 IU/L (ref 0–44)
AST: 19 IU/L (ref 0–40)
Albumin/Globulin Ratio: 1.7 (ref 1.1–2.5)
Alkaline Phosphatase: 71 IU/L (ref 39–117)
BUN/Creatinine Ratio: 16 (ref 9–20)
BUN: 14 mg/dL (ref 6–24)
Bilirubin Total: <0.2 mg/dL (ref 0.0–1.2)
CHLORIDE: 101 mmol/L (ref 97–108)
CO2: 22 mmol/L (ref 18–29)
Calcium: 9.4 mg/dL (ref 8.7–10.2)
Creatinine, Ser: 0.87 mg/dL (ref 0.76–1.27)
GFR, EST AFRICAN AMERICAN: 117 mL/min/{1.73_m2} (ref >59)
GFR, EST NON AFRICAN AMERICAN: 101 mL/min/{1.73_m2} (ref >59)
GLOBULIN, TOTAL: 2.5 g/dL (ref 1.5–4.5)
GLUCOSE: 99 mg/dL (ref 65–99)
Potassium: 4 mmol/L (ref 3.5–5.2)
SODIUM: 140 mmol/L (ref 134–144)
Total Protein: 6.8 g/dL (ref 6.0–8.5)

## 2015-04-04 ENCOUNTER — Ambulatory Visit (INDEPENDENT_AMBULATORY_CARE_PROVIDER_SITE_OTHER): Payer: BLUE CROSS/BLUE SHIELD | Admitting: Physician Assistant

## 2015-04-04 ENCOUNTER — Encounter: Payer: Self-pay | Admitting: Physician Assistant

## 2015-04-04 ENCOUNTER — Encounter (INDEPENDENT_AMBULATORY_CARE_PROVIDER_SITE_OTHER): Payer: Self-pay

## 2015-04-04 ENCOUNTER — Ambulatory Visit (INDEPENDENT_AMBULATORY_CARE_PROVIDER_SITE_OTHER): Payer: BLUE CROSS/BLUE SHIELD

## 2015-04-04 VITALS — BP 136/94 | HR 79 | Temp 97.2°F | Ht 72.0 in | Wt 198.0 lb

## 2015-04-04 DIAGNOSIS — M25562 Pain in left knee: Secondary | ICD-10-CM

## 2015-04-04 MED ORDER — MELOXICAM 15 MG PO TABS: 15.0000 mg | ORAL_TABLET | Freq: Every day | ORAL | Status: DC

## 2015-04-04 MED ORDER — METHYLPREDNISOLONE ACETATE 80 MG/ML IJ SUSP: 80.0000 mg | Freq: Once | INTRAMUSCULAR | Status: DC

## 2015-04-04 NOTE — Progress Notes (Signed)
   Subjective:    Patient ID: Benjamin Zamora, Benjamin Zamora    DOB: [birth year], 49 y.o.   MRN: [id number]  HPI 49 y/o Benjamin Zamora presents with c/o left knee pain x 2 weeks. Pain is worse with walking up stairs. He has tried ibuprofen with no relief. No previous injury.     Review of Systems  Constitutional: Negative.   HENT: Negative.   Eyes: Negative.   Respiratory: Negative.   Cardiovascular: Negative.   Gastrointestinal: Negative.   Genitourinary: Negative.   Musculoskeletal:       Left knee pain  Skin: Negative.   Psychiatric/Behavioral: Negative.        Objective:   Physical Exam  Constitutional: He is oriented to person, place, and time. He appears well-developed and well-nourished. No distress.  HENT:  Head: Normocephalic.  Musculoskeletal: He exhibits tenderness (medial jointline , left knee ). He exhibits no edema.  Neurological: He is alert and oriented to person, place, and time.  Skin: He is not diaphoretic.  Psychiatric: He has a normal mood and affect. His behavior is normal. Judgment and thought content normal.  Nursing note and vitals reviewed.         Assessment & Plan:  1. Knee pain, acute, left  - DG Knee 1-2 Views Left; Future - meloxicam (MOBIC) 15 MG tablet; Take 1 tablet (15 mg total) by mouth daily.  Dispense: 30 tablet; Refill: 0 - methylPREDNISolone acetate (DEPO-MEDROL) injection 80 mg; Inject 1 mL (80 mg total) into the articular space once.  - suggested PO prednisone but patient refused  RTO in 2 weeks if no improvement.   Tiffany A. Benjamin Stain PA-C

## 2015-07-10 ENCOUNTER — Other Ambulatory Visit: Payer: Self-pay | Admitting: Family

## 2015-07-10 NOTE — Telephone Encounter (Signed)
Last filled 06/02/15, last seen 12/11/14. Route to pool either way

## 2015-09-03 ENCOUNTER — Encounter: Payer: Self-pay | Admitting: Family Medicine

## 2015-09-03 ENCOUNTER — Ambulatory Visit (INDEPENDENT_AMBULATORY_CARE_PROVIDER_SITE_OTHER): Payer: BLUE CROSS/BLUE SHIELD | Admitting: Family Medicine

## 2015-09-03 VITALS — BP 138/84 | HR 91 | Temp 97.0°F | Ht 72.0 in | Wt 197.8 lb

## 2015-09-03 DIAGNOSIS — Z716 Tobacco abuse counseling: Secondary | ICD-10-CM | POA: Diagnosis not present

## 2015-09-03 DIAGNOSIS — Z720 Tobacco use: Secondary | ICD-10-CM | POA: Diagnosis not present

## 2015-09-03 MED ORDER — NICOTINE 21 MG/24HR TD PT24: 21.0000 mg | MEDICATED_PATCH | Freq: Every day | TRANSDERMAL | Status: DC

## 2015-09-03 MED ORDER — CLONAZEPAM 1 MG PO TABS: 1.0000 mg | ORAL_TABLET | Freq: Two times a day (BID) | ORAL | Status: DC | PRN

## 2015-09-03 NOTE — Progress Notes (Signed)
BP 138/84 mmHg  Pulse 91  Temp(Src) 97 F (36.1 C) (Oral)  Ht 6' (1.829 m)  Wt 197 lb 12.8 oz (89.721 kg)  BMI 26.82 kg/m2   Subjective:    Patient ID: Benjamin Zamora, male    DOB: [birth year], 50 y.o.   MRN: [id number]  HPI: Benjamin Zamora is a 50 y.o. male presenting on 09/03/2015 for Nicotine Dependence   HPI Smoking cessation Patient is currently smoking 1.5 packs per day and has desires to quit. He denies any chest pain or shortness of breath or issues along those lines but is ready to get to the point where he can quit. He denies any wheezing. He does have a chronic nonproductive cough but it is not too bad per him.  Relevant past medical, surgical, family and social history reviewed and updated as indicated. Interim medical history since our last visit reviewed. Allergies and medications reviewed and updated.  Review of Systems  Constitutional: Negative for fever and chills.  HENT: Negative for congestion, ear discharge and ear pain.   Eyes: Negative for discharge and visual disturbance.  Respiratory: Negative for cough, shortness of breath and wheezing.   Cardiovascular: Negative for chest pain and leg swelling.  Gastrointestinal: Negative for abdominal pain, diarrhea and constipation.  Genitourinary: Negative for difficulty urinating.  Musculoskeletal: Negative for back pain and gait problem.  Skin: Negative for rash.  Neurological: Negative for syncope, light-headedness and headaches.  All other systems reviewed and are negative.   Per HPI unless specifically indicated above     Medication List       This list is accurate as of: 09/03/15 10:42 AM.  Always use your most recent med list.               clonazePAM 1 MG tablet  Commonly known as:  KLONOPIN  TAKE ONE TABLET BY MOUTH TWICE DAILY AS NEEDED FOR ANXIETY     escitalopram 10 MG tablet  Commonly known as:  LEXAPRO  Take 1 tablet (10 mg total) by mouth daily.     nicotine 21 mg/24hr patch   Commonly known as:  EQ NICOTINE  Place 1 patch (21 mg total) onto the skin daily.     sildenafil 100 MG tablet  Commonly known as:  VIAGRA  Take 1 tablet (100 mg total) by mouth daily as needed for erectile dysfunction.           Objective:    BP 138/84 mmHg  Pulse 91  Temp(Src) 97 F (36.1 C) (Oral)  Ht 6' (1.829 m)  Wt 197 lb 12.8 oz (89.721 kg)  BMI 26.82 kg/m2  Wt Readings from Last 3 Encounters:  09/03/15 197 lb 12.8 oz (89.721 kg)  04/04/15 198 lb (89.812 kg)  12/11/14 187 lb (84.823 kg)    Physical Exam  Constitutional: He is oriented to person, place, and time. He appears well-developed and well-nourished. No distress.  Eyes: Conjunctivae and EOM are normal. Pupils are equal, round, and reactive to light. Right eye exhibits no discharge. No scleral icterus.  Cardiovascular: Normal rate, regular rhythm, normal heart sounds and intact distal pulses.   No murmur heard. Pulmonary/Chest: Effort normal and breath sounds normal. No respiratory distress. He has no wheezes.  Musculoskeletal: Normal range of motion. He exhibits no edema.  Neurological: He is alert and oriented to person, place, and time. Coordination normal.  Skin: Skin is warm and dry. No rash noted. He is not diaphoretic.  Psychiatric: He has a normal mood  and affect. His behavior is normal.  Nursing note and vitals reviewed.      Assessment & Plan:   Problem List Items Addressed This Visit    None    Visit Diagnoses    Encounter for smoking cessation counseling    -  Primary    Relevant Medications    nicotine (EQ NICOTINE) 21 mg/24hr patch        Follow up plan: Return in about 2 weeks (around 09/17/2015), or if symptoms worsen or fail to improve, for Follow-up smoking cessation.  Counseling provided for all of the vaccine components No orders of the defined types were placed in this encounter.    Caryl Pina, MD [facility] 09/03/2015, 10:42 AM

## 2015-10-13 ENCOUNTER — Encounter: Payer: BLUE CROSS/BLUE SHIELD | Admitting: Family Medicine

## 2015-10-15 ENCOUNTER — Encounter: Payer: Self-pay | Admitting: Family Medicine

## 2015-11-19 ENCOUNTER — Encounter: Payer: Self-pay | Admitting: *Deleted

## 2015-11-19 ENCOUNTER — Encounter (INDEPENDENT_AMBULATORY_CARE_PROVIDER_SITE_OTHER): Payer: Self-pay

## 2015-11-19 ENCOUNTER — Ambulatory Visit (INDEPENDENT_AMBULATORY_CARE_PROVIDER_SITE_OTHER): Payer: BLUE CROSS/BLUE SHIELD | Admitting: Family Medicine

## 2015-11-19 ENCOUNTER — Encounter: Payer: Self-pay | Admitting: Family Medicine

## 2015-11-19 VITALS — BP 122/78 | HR 84 | Temp 97.8°F | Ht 72.0 in | Wt 193.0 lb

## 2015-11-19 DIAGNOSIS — Z125 Encounter for screening for malignant neoplasm of prostate: Secondary | ICD-10-CM

## 2015-11-19 DIAGNOSIS — Z131 Encounter for screening for diabetes mellitus: Secondary | ICD-10-CM | POA: Diagnosis not present

## 2015-11-19 DIAGNOSIS — Z13220 Encounter for screening for lipoid disorders: Secondary | ICD-10-CM | POA: Diagnosis not present

## 2015-11-19 DIAGNOSIS — Z1211 Encounter for screening for malignant neoplasm of colon: Secondary | ICD-10-CM

## 2015-11-19 DIAGNOSIS — F411 Generalized anxiety disorder: Secondary | ICD-10-CM

## 2015-11-19 DIAGNOSIS — Z0000 Encounter for general adult medical examination without abnormal findings: Secondary | ICD-10-CM | POA: Diagnosis not present

## 2015-11-19 MED ORDER — CLONAZEPAM 1 MG PO TABS: 1.0000 mg | ORAL_TABLET | Freq: Two times a day (BID) | ORAL | Status: DC | PRN

## 2015-11-19 MED ORDER — ESCITALOPRAM OXALATE 10 MG PO TABS: 10.0000 mg | ORAL_TABLET | Freq: Every day | ORAL | Status: DC

## 2015-11-19 NOTE — Progress Notes (Signed)
BP 122/78 mmHg  Pulse 84  Temp(Src) 97.8 F (36.6 C) (Oral)  Ht 6' (1.829 m)  Wt 193 lb (87.544 kg)  BMI 26.17 kg/m2   Subjective:    Patient ID: Benjamin Zamora, Benjamin Zamora    DOB: 01-02-1966, 50 y.o.   MRN: [id number]  HPI: Benjamin Zamora is a 50 y.o. Benjamin Zamora presenting on 11/19/2015 for Annual Exam and Medication Refill   HPI Adult well exam Patient is a former adult well exam 90s 49. He is also coming is that he can get a referral for colonoscopy. He had one episode of dizziness on 3 days ago but it was very hot at his workplace that may be attributed to that. He has had none since. He denies any chest pains or shortness of breath. He denies any chest pain, shortness of breath, headaches or vision issues, abdominal complaints, diarrhea, nausea, vomiting, or joint issues.   Anxiety and depression Patient is coming in for anxiety and depression recheck. He says he has done very well on both these medications Lexapro and Klonopin. He is able to sleep at night with the Klonopin and the Lexapro controls his anxiety and his depression. He tried to go off of Voltaren for a little bit but he did feel a difference and feels like he needs to continue it. He has been on both at the same dose for 4 years. He tries to cut the Klonopin in half so last him a lot longer. He denies any suicidal ideations or thoughts of hurting himself.  Relevant past medical, surgical, family and social history reviewed and updated as indicated. Interim medical history since our last visit reviewed. Allergies and medications reviewed and updated.  Review of Systems  Constitutional: Negative for fever and chills.  HENT: Negative for ear discharge and ear pain.   Eyes: Negative for discharge and visual disturbance.  Respiratory: Negative for shortness of breath and wheezing.   Cardiovascular: Negative for chest pain and leg swelling.  Gastrointestinal: Negative for abdominal pain, diarrhea and constipation.    Genitourinary: Negative for difficulty urinating.  Musculoskeletal: Negative for back pain and gait problem.  Skin: Negative for rash.  Neurological: Negative for syncope, light-headedness and headaches.  Psychiatric/Behavioral: Positive for sleep disturbance. Negative for suicidal ideas, self-injury, dysphoric mood and decreased concentration. The patient is nervous/anxious.   All other systems reviewed and are negative.   Per HPI unless specifically indicated above     Medication List       This list is accurate as of: 11/19/15 11:14 AM.  Always use your most recent med list.               clonazePAM 1 MG tablet  Commonly known as:  KLONOPIN  Take 1 tablet (1 mg total) by mouth 2 (two) times daily as needed. for anxiety     escitalopram 10 MG tablet  Commonly known as:  LEXAPRO  Take 1 tablet (10 mg total) by mouth daily.     sildenafil 100 MG tablet  Commonly known as:  VIAGRA  Take 1 tablet (100 mg total) by mouth daily as needed for erectile dysfunction.           Objective:    BP 122/78 mmHg  Pulse 84  Temp(Src) 97.8 F (36.6 C) (Oral)  Ht 6' (1.829 m)  Wt 193 lb (87.544 kg)  BMI 26.17 kg/m2  Wt Readings from Last 3 Encounters:  11/19/15 193 lb (87.544 kg)  09/03/15 197 lb 12.8 oz (89.721  kg)  04/04/15 198 lb (89.812 kg)    Physical Exam  Constitutional: He is oriented to person, place, and time. He appears well-developed and well-nourished. No distress.  HENT:  Nose: Nose normal.  Mouth/Throat: Oropharynx is clear and moist. No oropharyngeal exudate.  Eyes: Conjunctivae and EOM are normal. Pupils are equal, round, and reactive to light. Right eye exhibits no discharge. No scleral icterus.  Neck: Neck supple. No thyromegaly present.  Cardiovascular: Normal rate, regular rhythm, normal heart sounds and intact distal pulses.   No murmur heard. Pulmonary/Chest: Effort normal and breath sounds normal. No respiratory distress. He has no wheezes.   Abdominal: He exhibits no distension. There is no tenderness. There is no rebound and no guarding.  Musculoskeletal: Normal range of motion. He exhibits no edema or tenderness.  Lymphadenopathy:    He has no cervical adenopathy.  Neurological: He is alert and oriented to person, place, and time. He displays normal reflexes. No cranial nerve deficit. He exhibits normal muscle tone. Coordination normal.  Skin: Skin is warm and dry. No rash noted. He is not diaphoretic.  Psychiatric: His speech is normal and behavior is normal. Judgment and thought content normal. His mood appears anxious. He does not exhibit a depressed mood. He expresses no suicidal ideation. He expresses no suicidal plans.  Vitals reviewed.       Assessment & Plan:   Problem List Items Addressed This Visit      Other   GAD (generalized anxiety disorder)   Relevant Medications   clonazePAM (KLONOPIN) 1 MG tablet   escitalopram (LEXAPRO) 10 MG tablet    Other Visit Diagnoses    Special screening for malignant neoplasms, colon    -  Primary    Relevant Orders    Ambulatory referral to Gastroenterology    Well adult exam        Screening for diabetes mellitus (DM)        Relevant Orders    CMP14+EGFR    Encounter for screening for lipid disorder        Relevant Orders    Lipid panel    Prostate cancer screening        Relevant Orders    PSA, total and free        Follow up plan: Return in about 6 months (around 05/21/2016), or if symptoms worsen or fail to improve, for anxiety recheck.  Counseling provided for all of the vaccine components Orders Placed This Encounter  Procedures  . Lipid panel  . CMP14+EGFR  . PSA, total and free  . Ambulatory referral to Gastroenterology    Caryl Pina, MD [facility] 11/19/2015, 11:14 AM

## 2015-11-20 ENCOUNTER — Encounter: Payer: Self-pay | Admitting: Gastroenterology

## 2015-11-20 LAB — CMP14+EGFR
ALBUMIN: 4.5 g/dL (ref 3.5–5.5)
ALK PHOS: 70 IU/L (ref 39–117)
ALT: 19 IU/L (ref 0–44)
AST: 19 IU/L (ref 0–40)
Albumin/Globulin Ratio: 1.9 (ref 1.2–2.2)
BILIRUBIN TOTAL: 0.5 mg/dL (ref 0.0–1.2)
BUN / CREAT RATIO: 15 (ref 9–20)
BUN: 13 mg/dL (ref 6–24)
CHLORIDE: 98 mmol/L (ref 96–106)
CO2: 22 mmol/L (ref 18–29)
Calcium: 9.5 mg/dL (ref 8.7–10.2)
Creatinine, Ser: 0.86 mg/dL (ref 0.76–1.27)
GFR calc Af Amer: 117 mL/min/{1.73_m2} (ref >59)
GFR calc non Af Amer: 101 mL/min/{1.73_m2} (ref >59)
GLOBULIN, TOTAL: 2.4 g/dL (ref 1.5–4.5)
GLUCOSE: 86 mg/dL (ref 65–99)
Potassium: 4.3 mmol/L (ref 3.5–5.2)
SODIUM: 140 mmol/L (ref 134–144)
Total Protein: 6.9 g/dL (ref 6.0–8.5)

## 2015-11-20 LAB — PSA, TOTAL AND FREE
PROSTATE SPECIFIC AG, SERUM: 1.3 ng/mL (ref 0.0–4.0)
PSA FREE: 0.22 ng/mL
PSA, Free Pct: 16.9 %

## 2015-11-20 LAB — LIPID PANEL
CHOL/HDL RATIO: 5.1 ratio — AB (ref 0.0–5.0)
CHOLESTEROL TOTAL: 216 mg/dL — AB (ref 100–199)
HDL: 42 mg/dL (ref >39)
LDL CALC: 116 mg/dL — AB (ref 0–99)
Triglycerides: 288 mg/dL — ABNORMAL HIGH (ref 0–149)
VLDL CHOLESTEROL CAL: 58 mg/dL — AB (ref 5–40)

## 2015-12-05 ENCOUNTER — Ambulatory Visit (INDEPENDENT_AMBULATORY_CARE_PROVIDER_SITE_OTHER): Payer: BLUE CROSS/BLUE SHIELD | Admitting: Nurse Practitioner

## 2015-12-05 VITALS — BP 133/87 | HR 84 | Temp 97.8°F | Ht 72.0 in | Wt 192.0 lb

## 2015-12-05 DIAGNOSIS — R0602 Shortness of breath: Secondary | ICD-10-CM

## 2015-12-05 DIAGNOSIS — F410 Panic disorder [episodic paroxysmal anxiety] without agoraphobia: Secondary | ICD-10-CM | POA: Diagnosis not present

## 2015-12-05 NOTE — Progress Notes (Signed)
   Subjective:    Patient ID: Benjamin Zamora, male    DOB: Nov 13, 1965, 50 y.o.   MRN: [id number]  HPI Patient comes in worried about his heart- he has been having panic attacks for many years and is on klonopin and lexapro- he has been under pretty good control and has not had an attack in several months- he said he was just sitting at table this morning and just started feeling funny- had some dizziness, flushing and felt like he was going to pass out- he had his blood pressure checked and it was 158/100- he is on no blood pressure meds.  Review of Systems  Constitutional: Negative.   Respiratory: Positive for shortness of breath (occasionalwhen he get supset). Negative for cough.   Cardiovascular: Positive for chest pain (?). Negative for palpitations.  Genitourinary: Negative.   Psychiatric/Behavioral: Positive for sleep disturbance. The patient is nervous/anxious.   All other systems reviewed and are negative.      Objective:   Physical Exam  Constitutional: He appears well-developed and well-nourished. No distress.  Cardiovascular: Normal rate, regular rhythm and normal heart sounds.   Pulmonary/Chest: Effort normal.  Neurological: He is alert.  Skin: Skin is warm.  Psychiatric: He has a normal mood and affect. His behavior is normal. Judgment and thought content normal.   BP 133/87 mmHg  Pulse 84  Temp(Src) 97.8 F (36.6 C) (Oral)  Ht 6' (1.829 m)  Wt 192 lb (87.091 kg)  BMI 26.03 kg/m2   EKG- NSR-Mary-Margaret Hassell Done, FNP      Assessment & Plan:   1. SOB (shortness of breath)   2. Panic attack    Probably due to missing dose of klonopin last night which he has been taking every night for >4 years Continue current meds Follow up prn  Mary-Margaret Hassell Done, FNP

## 2015-12-05 NOTE — Patient Instructions (Signed)

## 2015-12-29 ENCOUNTER — Ambulatory Visit (AMBULATORY_SURGERY_CENTER): Payer: Self-pay

## 2015-12-29 VITALS — Ht 72.0 in | Wt 189.0 lb

## 2015-12-29 DIAGNOSIS — Z1211 Encounter for screening for malignant neoplasm of colon: Secondary | ICD-10-CM

## 2015-12-29 NOTE — Progress Notes (Signed)
No egg or soy allergy.  No previous complications from anesthesia. No home O2. No diet meds. 

## 2015-12-30 ENCOUNTER — Encounter: Payer: Self-pay | Admitting: Gastroenterology

## 2016-01-12 ENCOUNTER — Encounter: Payer: Self-pay | Admitting: Gastroenterology

## 2016-01-12 ENCOUNTER — Ambulatory Visit (AMBULATORY_SURGERY_CENTER): Payer: BLUE CROSS/BLUE SHIELD | Admitting: Gastroenterology

## 2016-01-12 VITALS — BP 114/78 | HR 71 | Temp 98.6°F | Resp 16 | Ht 72.0 in | Wt 189.0 lb

## 2016-01-12 DIAGNOSIS — K621 Rectal polyp: Secondary | ICD-10-CM | POA: Diagnosis not present

## 2016-01-12 DIAGNOSIS — Z1211 Encounter for screening for malignant neoplasm of colon: Secondary | ICD-10-CM

## 2016-01-12 DIAGNOSIS — D129 Benign neoplasm of anus and anal canal: Secondary | ICD-10-CM

## 2016-01-12 DIAGNOSIS — D128 Benign neoplasm of rectum: Secondary | ICD-10-CM

## 2016-01-12 MED ORDER — SODIUM CHLORIDE 0.9 % IV SOLN: 500.0000 mL | INTRAVENOUS | Status: DC

## 2016-01-12 NOTE — Op Note (Signed)
[place] Patient Name: Benjamin Zamora Procedure Date: 01/12/2016 1:21 PM MRN: [id number] Endoscopist: Mallie Mussel L. Loletha Carrow , MD Age: 50 Referring MD:  Date of Birth: 06/21/1966 Gender: Male Account #: [id number] Procedure:                Colonoscopy Indications:              Screening for colorectal malignant neoplasm, This                            is the patient's first colonoscopy Medicines:                Monitored Anesthesia Care Procedure:                Pre-Anesthesia Assessment:                           - Prior to the procedure, a History and Physical                            was performed, and patient medications and                            allergies were reviewed. The patient's tolerance of                            previous anesthesia was also reviewed. The risks                            and benefits of the procedure and the sedation                            options and risks were discussed with the patient.                            All questions were answered, and informed consent                            was obtained. Prior Anticoagulants: The patient has                            taken no previous anticoagulant or antiplatelet                            agents. ASA Grade Assessment: II - A patient with                            mild systemic disease. After reviewing the risks                            and benefits, the patient was deemed in                            satisfactory condition to undergo the procedure.  After obtaining informed consent, the colonoscope                            was passed under direct vision. Throughout the                            procedure, the patient's blood pressure, pulse, and                            oxygen saturations were monitored continuously. The                            Model CF-HQ190L [id number]) scope was introduced                            through the anus and  advanced to the the cecum,                            identified by appendiceal orifice and ileocecal                            valve. The colonoscopy was performed without                            difficulty. The patient tolerated the procedure                            well. The quality of the bowel preparation was                            excellent. The ileocecal valve, appendiceal                            orifice, and rectum were photographed. The bowel                            preparation used was Miralax. Scope In: 1:37:05 PM Scope Out: 1:53:14 PM Scope Withdrawal Time: 0 hours 13 minutes 9 seconds  Total Procedure Duration: 0 hours 16 minutes 9 seconds  Findings:                 The perianal and digital rectal examinations were                            normal.                           A 2 mm polyp was found in the rectum. The polyp was                            sessile. The polyp was removed with a cold biopsy                            forceps. Resection and retrieval were complete.  Internal hemorrhoids were found during                            retroflexion. The hemorrhoids were medium-sized and                            Grade I (internal hemorrhoids that do not prolapse).                           The exam was otherwise without abnormality. Complications:            No immediate complications. Estimated Blood Loss:     Estimated blood loss: none. Impression:               - One 2 mm polyp in the rectum, removed with a cold                            biopsy forceps. Resected and retrieved.                           - Internal hemorrhoids.                           - The examination was otherwise normal. Recommendation:           - Patient has a contact number available for                            emergencies. The signs and symptoms of potential                            delayed complications were discussed with the                             patient. Return to normal activities tomorrow.                            Written discharge instructions were provided to the                            patient.                           - Resume previous diet.                           - Continue present medications.                           - Await pathology results.                           - Repeat colonoscopy is recommended for                            surveillance. The colonoscopy date will be  determined after pathology results from today's                            exam become available for review. Henry L. Loletha Carrow, MD 01/12/2016 1:58:36 PM This report has been signed electronically.

## 2016-01-12 NOTE — Progress Notes (Signed)
stable to RR 

## 2016-01-12 NOTE — Patient Instructions (Addendum)
YOU HAD AN ENDOSCOPIC PROCEDURE TODAY AT THE [facility]:   Refer to the procedure report that was given to you for any specific questions about what was found during the examination.  If the procedure report does not answer your questions, please call your gastroenterologist to clarify.  If you requested that your care partner not be given the details of your procedure findings, then the procedure report has been included in a sealed envelope for you to review at your convenience later.  YOU SHOULD EXPECT: Some feelings of bloating in the abdomen. Passage of more gas than usual.  Walking can help get rid of the air that was put into your GI tract during the procedure and reduce the bloating. If you had a lower endoscopy (such as a colonoscopy or flexible sigmoidoscopy) you may notice spotting of blood in your stool or on the toilet paper. If you underwent a bowel prep for your procedure, you may not have a normal bowel movement for a few days.  Please Note:  You might notice some irritation and congestion in your nose or some drainage.  This is from the oxygen used during your procedure.  There is no need for concern and it should clear up in a day or so.  SYMPTOMS TO REPORT IMMEDIATELY:   Following lower endoscopy (colonoscopy or flexible sigmoidoscopy):  Excessive amounts of blood in the stool  Significant tenderness or worsening of abdominal pains  Swelling of the abdomen that is new, acute  Fever of 100F or higher   For urgent or emergent issues, a gastroenterologist can be reached at any hour by calling [phone].   DIET: Your first meal following the procedure should be a small meal and then it is ok to progress to your normal diet. Heavy or fried foods are harder to digest and may make you feel nauseous or bloated.  Likewise, meals heavy in dairy and vegetables can increase bloating.  Drink plenty of fluids but you should avoid alcoholic beverages for 24  hours.  ACTIVITY:  You should plan to take it easy for the rest of today and you should NOT DRIVE or use heavy machinery until tomorrow (because of the sedation medicines used during the test).    FOLLOW UP: Our staff will call the number listed on your records the next business day following your procedure to check on you and address any questions or concerns that you may have regarding the information given to you following your procedure. If we do not reach you, we will leave a message.  However, if you are feeling well and you are not experiencing any problems, there is no need to return our call.  We will assume that you have returned to your regular daily activities without incident.  If any biopsies were taken you will be contacted by phone or by letter within the next 1-3 weeks.  Please call us at [phone] if you have not heard about the biopsies in 3 weeks.    SIGNATURES/CONFIDENTIALITY: You and/or your care partner have signed paperwork which will be entered into your electronic medical record.  These signatures attest to the fact that that the information above on your After Visit Summary has been reviewed and is understood.  Full responsibility of the confidentiality of this discharge information lies with you and/or your care-partner.  Polyp and hemorrhoid information given. 

## 2016-01-12 NOTE — Progress Notes (Signed)
Called to room to assist during endoscopic procedure.  Patient ID and intended procedure confirmed with present staff. Received instructions for my participation in the procedure from the performing physician.  

## 2016-01-13 ENCOUNTER — Telehealth: Payer: Self-pay | Admitting: *Deleted

## 2016-01-13 NOTE — Telephone Encounter (Signed)
  Follow up Call-  Call back number 01/12/2016  Post procedure Call Back phone  # [phone]  Permission to leave phone message Yes     Patient questions:  Do you have a fever, pain , or abdominal swelling? No. Pain Score  0 *  Have you tolerated food without any problems? Yes.    Have you been able to return to your normal activities? Yes.    Do you have any questions about your discharge instructions: Diet   No. Medications  No. Follow up visit  No.  Do you have questions or concerns about your Care? No.  Actions: * If pain score is 4 or above: No action needed, pain <4.

## 2016-01-19 ENCOUNTER — Encounter: Payer: Self-pay | Admitting: Gastroenterology

## 2016-06-06 ENCOUNTER — Other Ambulatory Visit: Payer: Self-pay | Admitting: Family Medicine

## 2016-06-06 DIAGNOSIS — F411 Generalized anxiety disorder: Secondary | ICD-10-CM

## 2016-06-09 NOTE — Telephone Encounter (Signed)
Go ahead and call in refill, please inform patient that he is due for an appointment some time in the near future before next refill

## 2016-06-21 ENCOUNTER — Ambulatory Visit (INDEPENDENT_AMBULATORY_CARE_PROVIDER_SITE_OTHER): Payer: BLUE CROSS/BLUE SHIELD | Admitting: Family Medicine

## 2016-06-21 ENCOUNTER — Encounter: Payer: Self-pay | Admitting: Family Medicine

## 2016-06-21 VITALS — BP 139/85 | HR 92 | Temp 97.4°F | Ht 72.0 in | Wt 192.5 lb

## 2016-06-21 DIAGNOSIS — R12 Heartburn: Secondary | ICD-10-CM

## 2016-06-21 DIAGNOSIS — F3341 Major depressive disorder, recurrent, in partial remission: Secondary | ICD-10-CM

## 2016-06-21 DIAGNOSIS — F411 Generalized anxiety disorder: Secondary | ICD-10-CM

## 2016-06-21 DIAGNOSIS — N529 Male erectile dysfunction, unspecified: Secondary | ICD-10-CM | POA: Diagnosis not present

## 2016-06-21 MED ORDER — ESCITALOPRAM OXALATE 10 MG PO TABS: 10.0000 mg | ORAL_TABLET | Freq: Every day | ORAL | 3 refills | Status: DC

## 2016-06-21 MED ORDER — CLONAZEPAM 1 MG PO TABS: 1.0000 mg | ORAL_TABLET | Freq: Two times a day (BID) | ORAL | 2 refills | Status: DC | PRN

## 2016-06-21 MED ORDER — SILDENAFIL CITRATE 100 MG PO TABS: 100.0000 mg | ORAL_TABLET | Freq: Every day | ORAL | 11 refills | Status: DC | PRN

## 2016-06-21 NOTE — Progress Notes (Signed)
BP 139/85   Pulse 92   Temp 97.4 F (36.3 C) (Oral)   Ht 6' (1.829 m)   Wt 192 lb 8 oz (87.3 kg)   BMI 26.11 kg/m    Subjective:    Patient ID: Benjamin Zamora, male    DOB: March 31, 1966, 50 y.o.   MRN: [id number]  HPI: Benjamin Zamora is a 50 y.o. male presenting on 06/21/2016 for Cough (feels like burning in chest, started today) and Medication Refill   HPI Gen. anxiety disorder and depression recheck Patient has been having anxiety and depression and has been on medications for the past few years. He is currently on Lexapro 10 mg. He says Lexapro is doing very well for him and he denies any major issues with that. He is also on Klonopin 1 mg but he cuts in half and only uses 1 every other night about. He is sleeping at night with this and he is feeling like his anxiety and depression are doing very well. He denies any suicidal ideations or thoughts of hurting himself.  Erectile dysfunction Patient has been having some difficulties with erections, and both getting an erection and maintaining erection. He has been having this issue for at least 5 months now intermittently and he is coming in to see if he can get a medication to help with this.  Cough and burning in chest Patient has been having cough and burning in his chest that started today. He denies any production from his cough. He denies any fevers or chills. He said it started when he woke up this morning. He does admit that he did eat something large last night that was very spicy and fatty. He has not tried anything for it.  Relevant past medical, surgical, family and social history reviewed and updated as indicated. Interim medical history since our last visit reviewed. Allergies and medications reviewed and updated.  Review of Systems  Constitutional: Negative for chills and fever.  HENT: Negative for congestion, rhinorrhea, sinus pain, sinus pressure, sore throat and voice change.   Respiratory: Positive for cough  and chest tightness. Negative for shortness of breath and wheezing.   Cardiovascular: Negative for chest pain and leg swelling.  Gastrointestinal: Positive for abdominal pain. Negative for blood in stool, constipation, diarrhea, nausea and vomiting.  Musculoskeletal: Negative for back pain and gait problem.  Skin: Negative for rash.  Psychiatric/Behavioral: Negative for dysphoric mood, self-injury, sleep disturbance and suicidal ideas. The patient is not nervous/anxious.   All other systems reviewed and are negative.   Per HPI unless specifically indicated above      Objective:    BP 139/85   Pulse 92   Temp 97.4 F (36.3 C) (Oral)   Ht 6' (1.829 m)   Wt 192 lb 8 oz (87.3 kg)   BMI 26.11 kg/m   Wt Readings from Last 3 Encounters:  06/21/16 192 lb 8 oz (87.3 kg)  01/12/16 189 lb (85.7 kg)  12/29/15 189 lb (85.7 kg)    Physical Exam  Constitutional: He is oriented to person, place, and time. He appears well-developed and well-nourished. No distress.  Eyes: Conjunctivae are normal. Right eye exhibits no discharge. No scleral icterus.  Neck: Neck supple. No thyromegaly present.  Cardiovascular: Normal rate, regular rhythm, normal heart sounds and intact distal pulses.   No murmur heard. Pulmonary/Chest: Effort normal and breath sounds normal. No respiratory distress. He has no wheezes.  Abdominal: Soft. Bowel sounds are normal. He exhibits no distension. There is  no tenderness (No discomfort on exam). There is no rebound.  Musculoskeletal: Normal range of motion. He exhibits no edema.  Lymphadenopathy:    He has no cervical adenopathy.  Neurological: He is alert and oriented to person, place, and time. Coordination normal.  Skin: Skin is warm and dry. No rash noted. He is not diaphoretic.  Psychiatric: He has a normal mood and affect. His behavior is normal. Judgment and thought content normal.  Nursing note and vitals reviewed.     Assessment & Plan:   Problem List Items  Addressed This Visit      Other   GAD (generalized anxiety disorder) - Primary   Relevant Medications   clonazePAM (KLONOPIN) 1 MG tablet   escitalopram (LEXAPRO) 10 MG tablet   Depression   Relevant Medications   clonazePAM (KLONOPIN) 1 MG tablet   escitalopram (LEXAPRO) 10 MG tablet    Other Visit Diagnoses    Erectile dysfunction, unspecified erectile dysfunction type       Relevant Medications   sildenafil (VIAGRA) 100 MG tablet   Heartburn       Recommended that he try over-the-counter Zantac first       Follow up plan: Return if symptoms worsen or fail to improve.  Counseling provided for all of the vaccine components No orders of the defined types were placed in this encounter.   Caryl Pina, MD [facility] 06/21/2016, 5:36 PM

## 2016-11-22 ENCOUNTER — Ambulatory Visit (INDEPENDENT_AMBULATORY_CARE_PROVIDER_SITE_OTHER): Payer: BLUE CROSS/BLUE SHIELD | Admitting: Family Medicine

## 2016-11-22 ENCOUNTER — Encounter: Payer: Self-pay | Admitting: Family Medicine

## 2016-11-22 VITALS — BP 133/88 | HR 91 | Temp 97.7°F | Ht 72.0 in | Wt 187.0 lb

## 2016-11-22 DIAGNOSIS — D485 Neoplasm of uncertain behavior of skin: Secondary | ICD-10-CM | POA: Diagnosis not present

## 2016-11-22 NOTE — Progress Notes (Signed)
   Subjective:  Patient ID: Benjamin Zamora, male    DOB: 01/16/1966  Age: 51 y.o. MRN: [id number]  CC: spot on head (pt here today c/o "spot on head" x 2 months that he wants checked out)   HPI Benjamin Zamora presents for lesion left temple. Itches. Skin cancer runs in family, worries pt. He tends to pick at it due to the itching.  History Benjamin Zamora has a past medical history of Anxiety; Depression; Drug abuse (1995); and Hyperlipidemia.   He has a past surgical history that includes Appendectomy.   His family history includes Cancer in his mother; Hypertension in his father; Thyroid disease in his brother and mother.He reports that he has been smoking Cigarettes.  He has been smoking about 1.50 packs per day. He has never used smokeless tobacco. He reports that he drinks about 3.6 - 4.8 oz of alcohol per week . He reports that he does not use drugs.  Current Outpatient Prescriptions on File Prior to Visit  Medication Sig Dispense Refill  . clonazePAM (KLONOPIN) 1 MG tablet Take 1 tablet (1 mg total) by mouth 2 (two) times daily as needed. for anxiety 30 tablet 2  . escitalopram (LEXAPRO) 10 MG tablet Take 1 tablet (10 mg total) by mouth daily. 90 tablet 3   No current facility-administered medications on file prior to visit.     ROS Review of Systems noncontributory Objective:  BP 133/88   Pulse 91   Temp 97.7 F (36.5 C) (Oral)   Ht 6' (1.829 m)   Wt 187 lb (84.8 kg)   BMI 25.36 kg/m   Physical Exam 9 mm rough surfaced, raised, circumscribed lesion, left temple, behind hairline. Assessment & Plan:   Benjamin Zamora was seen today for spot on head.  Diagnoses and all orders for this visit:  Neoplasm of uncertain behavior of skin   I have discontinued Benjamin Zamora's sildenafil. I am also having him maintain his clonazePAM and escitalopram.  No orders of the defined types were placed in this encounter.    Follow-up: Return in about 1 week (around 11/29/2016) for  Dettinger, procedure, skin lesion.  Benjamin Zamora, M.D.

## 2016-12-04 ENCOUNTER — Other Ambulatory Visit: Payer: Self-pay | Admitting: Family Medicine

## 2016-12-04 DIAGNOSIS — F3341 Major depressive disorder, recurrent, in partial remission: Secondary | ICD-10-CM

## 2016-12-04 DIAGNOSIS — F411 Generalized anxiety disorder: Secondary | ICD-10-CM

## 2016-12-06 ENCOUNTER — Ambulatory Visit (INDEPENDENT_AMBULATORY_CARE_PROVIDER_SITE_OTHER): Payer: BLUE CROSS/BLUE SHIELD | Admitting: Family Medicine

## 2016-12-06 ENCOUNTER — Encounter: Payer: Self-pay | Admitting: Family Medicine

## 2016-12-06 VITALS — BP 119/80 | HR 82 | Temp 98.0°F | Ht 72.0 in | Wt 189.0 lb

## 2016-12-06 DIAGNOSIS — L570 Actinic keratosis: Secondary | ICD-10-CM

## 2016-12-06 DIAGNOSIS — F411 Generalized anxiety disorder: Secondary | ICD-10-CM

## 2016-12-06 DIAGNOSIS — L821 Other seborrheic keratosis: Secondary | ICD-10-CM

## 2016-12-06 MED ORDER — CLONAZEPAM 1 MG PO TABS
1.0000 mg | ORAL_TABLET | Freq: Two times a day (BID) | ORAL | 2 refills | Status: DC | PRN
Start: 2016-12-06 — End: 2017-06-15

## 2016-12-06 NOTE — Progress Notes (Signed)
BP 119/80   Pulse 82   Temp 98 F (36.7 C) (Oral)   Ht 6' (1.829 m)   Wt 189 lb (85.7 kg)   BMI 25.63 kg/m    Subjective:    Patient ID: Benjamin Zamora, male    DOB: 1966/04/24, 51 y.o.   MRN: [id number]  HPI: Benjamin Zamora is a 51 y.o. male presenting on 12/06/2016 for Lesion on left side of head (would like to have it removed, concerned because of family history of cancer) and Medication Refill (Clonazepam)   HPI Concerning skin lesion on the left side of his head Patient has concerning skin lesion on the left side of his head that is been there for the past few months at least what may be a year. He was seen by one of my colleagues that long ago and said that it would be a good one to get taken care of. The lesion is on the left side of his head near his hairline in the actual hairline itself. He denies any redness or warmth or drainage or purulence out of the site. He also says that he has small spot on his ear that has not healed and has been there for at least a year that he is also been concerned about. He denies any drainage or redness or warmth that site either. It's on the outside of his left auricle  Anxiety refill Patient is coming up on anxiety medication refill. He uses the benzodiazepines sparingly just needs a refill for it. Denies any suicidal ideations or thoughts of hurting himself.  Relevant past medical, surgical, family and social history reviewed and updated as indicated. Interim medical history since our last visit reviewed. Allergies and medications reviewed and updated.  Review of Systems  Constitutional: Negative for chills and fever.  Respiratory: Negative for shortness of breath and wheezing.   Cardiovascular: Negative for chest pain and leg swelling.  Musculoskeletal: Negative for back pain and gait problem.  Skin: Negative for rash.  Psychiatric/Behavioral: Negative for decreased concentration, self-injury, sleep disturbance and suicidal  ideas. The patient is not nervous/anxious and is not hyperactive.   All other systems reviewed and are negative.   Per HPI unless specifically indicated above      Objective:    BP 119/80   Pulse 82   Temp 98 F (36.7 C) (Oral)   Ht 6' (1.829 m)   Wt 189 lb (85.7 kg)   BMI 25.63 kg/m   Wt Readings from Last 3 Encounters:  12/06/16 189 lb (85.7 kg)  11/22/16 187 lb (84.8 kg)  06/21/16 192 lb 8 oz (87.3 kg)    Physical Exam  Constitutional: He is oriented to person, place, and time. He appears well-developed and well-nourished. No distress.  Eyes: Conjunctivae are normal. No scleral icterus.  Cardiovascular: Normal rate, regular rhythm, normal heart sounds and intact distal pulses.   No murmur heard. Pulmonary/Chest: Effort normal and breath sounds normal. No respiratory distress. He has no wheezes.  Musculoskeletal: Normal range of motion. He exhibits no edema.  Neurological: He is alert and oriented to person, place, and time. Coordination normal.  Skin: Skin is warm and dry. 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 No rash noted. He is not diaphoretic.  Second skin lesion is on the outer part of the left auricle as small as a pinpoint but has been there  for some time and appears to be scabbed over and has not been healing for him.  Psychiatric: He has a normal mood and affect. His behavior is normal.  Nursing note and vitals reviewed.  Skin lesion destruction: Patient has 2 skin lesions, one in left sideburn and one on the outer part of left auricle that cryotherapy was performed on. Used 3 burst of 15 seconds each. Inform patient of likelihood the blister up and to let it fall off on its own.    Assessment & Plan:   Problem List Items Addressed This Visit      Other   GAD (generalized anxiety disorder)   Relevant Medications   clonazePAM (KLONOPIN) 1 MG  tablet    Other Visit Diagnoses    Seborrheic keratosis    -  Primary   Left temple in hairline   Actinic keratosis       Left outer ear on the upper auricle       Follow up plan: Return in about 6 months (around 06/08/2017), or if symptoms worsen or fail to improve.  Counseling provided for all of the vaccine components No orders of the defined types were placed in this encounter.   Caryl Pina, MD [facility] 12/06/2016, 4:35 PM

## 2017-05-12 ENCOUNTER — Encounter: Payer: Self-pay | Admitting: Family Medicine

## 2017-05-12 ENCOUNTER — Ambulatory Visit (INDEPENDENT_AMBULATORY_CARE_PROVIDER_SITE_OTHER): Payer: BLUE CROSS/BLUE SHIELD | Admitting: Family Medicine

## 2017-05-12 VITALS — BP 116/76 | HR 101 | Temp 98.1°F | Ht 72.0 in | Wt 191.0 lb

## 2017-05-12 DIAGNOSIS — J0101 Acute recurrent maxillary sinusitis: Secondary | ICD-10-CM | POA: Diagnosis not present

## 2017-05-12 MED ORDER — CEFDINIR 300 MG PO CAPS: 300.0000 mg | ORAL_CAPSULE | Freq: Two times a day (BID) | ORAL | 0 refills | Status: DC

## 2017-05-12 NOTE — Progress Notes (Signed)
BP 116/76   Pulse (!) 101   Temp 98.1 F (36.7 C) (Oral)   Ht 6' (1.829 m)   Wt 191 lb (86.6 kg)   BMI 25.90 kg/m    Subjective:    Patient ID: Benjamin Zamora, male    DOB: 19-Mar-1966, 51 y.o.   MRN: [id number]  HPI: Benjamin Zamora is a 51 y.o. male presenting on 05/12/2017 for Nasal Congestion   HPI Nasal congestion and cough Patient is coming in with nasal congestion and cough that is been going on for the past week and 1 day and he has been trying over-the-counter option with Mucinex and Delsym and an allergy pill without much success.  He says that he is starting to feel like it is getting down into his chest and starting to get burning down in there.  His cough is productive of yellow-green sputum.  He denies any fevers or chills or shortness of breath.  He does feel like he is getting a little bit of wheezing and rattling.  The Mucinex and Delsym have helped but are not getting it to clear  Relevant past medical, surgical, family and social history reviewed and updated as indicated. Interim medical history since our last visit reviewed. Allergies and medications reviewed and updated.  Review of Systems  Constitutional: Negative for chills and fever.  HENT: Positive for congestion, postnasal drip, rhinorrhea and sinus pressure. Negative for ear discharge, ear pain, sneezing, sore throat and voice change.   Eyes: Negative for pain, discharge, redness and visual disturbance.  Respiratory: Positive for cough. Negative for shortness of breath and wheezing.   Cardiovascular: Negative for chest pain and leg swelling.  Musculoskeletal: Negative for gait problem.  Skin: Negative for rash.  All other systems reviewed and are negative.   Per HPI unless specifically indicated above   Allergies as of 05/12/2017   No Known Allergies     Medication List       Accurate as of 05/12/17  1:29 PM. Always use your most recent med list.          cefdinir 300 MG  capsule Commonly known as:  OMNICEF Take 1 capsule (300 mg total) by mouth 2 (two) times daily. 1 po BID   clonazePAM 1 MG tablet Commonly known as:  KLONOPIN Take 1 tablet (1 mg total) by mouth 2 (two) times daily as needed. for anxiety   escitalopram 10 MG tablet Commonly known as:  LEXAPRO Take 1 tablet (10 mg total) by mouth daily.          Objective:    BP 116/76   Pulse (!) 101   Temp 98.1 F (36.7 C) (Oral)   Ht 6' (1.829 m)   Wt 191 lb (86.6 kg)   BMI 25.90 kg/m   Wt Readings from Last 3 Encounters:  05/12/17 191 lb (86.6 kg)  12/06/16 189 lb (85.7 kg)  11/22/16 187 lb (84.8 kg)    Physical Exam  Constitutional: He is oriented to person, place, and time. He appears well-developed and well-nourished. No distress.  HENT:  Right Ear: Tympanic membrane, external ear and ear canal normal.  Left Ear: Tympanic membrane, external ear and ear canal normal.  Nose: Mucosal edema and rhinorrhea present. No sinus tenderness. No epistaxis. Right sinus exhibits maxillary sinus tenderness. Right sinus exhibits no frontal sinus tenderness. Left sinus exhibits maxillary sinus tenderness. Left sinus exhibits no frontal sinus tenderness.  Mouth/Throat: Uvula is midline and mucous membranes are normal. Posterior oropharyngeal edema  and posterior oropharyngeal erythema present. No oropharyngeal exudate or tonsillar abscesses.  Eyes: Conjunctivae are normal. No scleral icterus.  Neck: Neck supple. No thyromegaly present.  Cardiovascular: Normal rate, regular rhythm, normal heart sounds and intact distal pulses.   No murmur heard. Pulmonary/Chest: Effort normal and breath sounds normal. No respiratory distress. He has no wheezes. He has no rales.  Musculoskeletal: Normal range of motion. He exhibits no edema.  Lymphadenopathy:    He has no cervical adenopathy.  Neurological: He is alert and oriented to person, place, and time. Coordination normal.  Skin: Skin is warm and dry. No rash  noted. He is not diaphoretic.  Psychiatric: He has a normal mood and affect. His behavior is normal.  Nursing note and vitals reviewed.       Assessment & Plan:   Problem List Items Addressed This Visit    None    Visit Diagnoses    Acute recurrent maxillary sinusitis    -  Primary   Relevant Medications   cefdinir (OMNICEF) 300 MG capsule       Follow up plan: Return if symptoms worsen or fail to improve.  Counseling provided for all of the vaccine components No orders of the defined types were placed in this encounter.   Caryl Pina, MD [facility] 05/12/2017, 1:29 PM

## 2017-06-13 ENCOUNTER — Other Ambulatory Visit: Payer: Self-pay | Admitting: Family Medicine

## 2017-06-13 DIAGNOSIS — F411 Generalized anxiety disorder: Secondary | ICD-10-CM

## 2017-06-14 NOTE — Telephone Encounter (Signed)
Apt made

## 2017-06-14 NOTE — Telephone Encounter (Signed)
Last seen 05/12/17  Dr D  If approved route to nurse to call into [facility]

## 2017-06-14 NOTE — Telephone Encounter (Signed)
Patient NTBS for follow up and lab work  

## 2017-06-15 ENCOUNTER — Other Ambulatory Visit: Payer: Self-pay | Admitting: Family Medicine

## 2017-06-15 DIAGNOSIS — F411 Generalized anxiety disorder: Secondary | ICD-10-CM

## 2017-06-20 ENCOUNTER — Ambulatory Visit: Payer: BLUE CROSS/BLUE SHIELD | Admitting: Family Medicine

## 2017-06-20 ENCOUNTER — Encounter: Payer: Self-pay | Admitting: Family Medicine

## 2017-06-20 VITALS — BP 129/84 | HR 94 | Temp 98.5°F | Ht 72.0 in | Wt 194.0 lb

## 2017-06-20 DIAGNOSIS — F411 Generalized anxiety disorder: Secondary | ICD-10-CM | POA: Diagnosis not present

## 2017-06-20 DIAGNOSIS — F3341 Major depressive disorder, recurrent, in partial remission: Secondary | ICD-10-CM

## 2017-06-20 MED ORDER — CLONAZEPAM 1 MG PO TABS: 1.0000 mg | ORAL_TABLET | Freq: Two times a day (BID) | ORAL | 5 refills | Status: DC | PRN

## 2017-06-20 MED ORDER — ESCITALOPRAM OXALATE 10 MG PO TABS: 10.0000 mg | ORAL_TABLET | Freq: Every day | ORAL | 3 refills | Status: DC

## 2017-06-20 NOTE — Progress Notes (Signed)
BP (!) 133/92   Pulse 94   Temp 98.5 F (36.9 C) (Oral)   Ht 6' (1.829 m)   Wt 194 lb (88 kg)   BMI 26.31 kg/m    Subjective:    Patient ID: Benjamin Zamora, male    DOB: 1965/09/25, 51 y.o.   MRN: [id number]  HPI: Benjamin Zamora is a 51 y.o. male presenting on 06/20/2017 for Anxiety/Depression (follow up)   HPI Anxiety depression recheck Patient is coming in for anxiety and depression recheck.  He says that he is still doing very well on his medications and has been on it good constant dose.  He says he notices when he misses his medications but he does not do that very frequently.  He denies any suicidal ideations or thoughts of hurting himself. Depression screen [facility] 2/9 06/20/2017 12/06/2016 11/22/2016 11/19/2015 09/03/2015  Decreased Interest 0 0 0 0 0  Down, Depressed, Hopeless 0 0 0 0 0  PHQ - 2 Score 0 0 0 0 0     Relevant past medical, surgical, family and social history reviewed and updated as indicated. Interim medical history since our last visit reviewed. Allergies and medications reviewed and updated.  Review of Systems  Constitutional: Negative for chills and fever.  Eyes: Negative for discharge.  Respiratory: Negative for shortness of breath and wheezing.   Cardiovascular: Negative for chest pain and leg swelling.  Musculoskeletal: Negative for back pain and gait problem.  Skin: Negative for rash.  Psychiatric/Behavioral: Negative for confusion, dysphoric mood, self-injury, sleep disturbance and suicidal ideas. The patient is not nervous/anxious.   All other systems reviewed and are negative.   Per HPI unless specifically indicated above        Objective:    BP (!) 133/92   Pulse 94   Temp 98.5 F (36.9 C) (Oral)   Ht 6' (1.829 m)   Wt 194 lb (88 kg)   BMI 26.31 kg/m   Wt Readings from Last 3 Encounters:  06/20/17 194 lb (88 kg)  05/12/17 191 lb (86.6 kg)  12/06/16 189 lb (85.7 kg)    Physical Exam  Constitutional: He is oriented to  person, place, and time. He appears well-developed and well-nourished. No distress.  Eyes: Conjunctivae are normal. No scleral icterus.  Cardiovascular: Normal rate, regular rhythm, normal heart sounds and intact distal pulses.  No murmur heard. Pulmonary/Chest: Effort normal and breath sounds normal. No respiratory distress. He has no wheezes. He has no rales.  Musculoskeletal: Normal range of motion. He exhibits edema (Trace).  Neurological: He is alert and oriented to person, place, and time. Coordination normal.  Skin: Skin is warm and dry. No rash noted. He is not diaphoretic.  Psychiatric: He has a normal mood and affect. His behavior is normal. Judgment normal. His mood appears not anxious. He does not exhibit a depressed mood. He expresses no suicidal ideation. He expresses no suicidal plans.  Nursing note and vitals reviewed.       Assessment & Plan:   Problem List Items Addressed This Visit      Other   GAD (generalized anxiety disorder)   Relevant Medications   escitalopram (LEXAPRO) 10 MG tablet   clonazePAM (KLONOPIN) 1 MG tablet   Depression - Primary   Relevant Medications   escitalopram (LEXAPRO) 10 MG tablet   clonazePAM (KLONOPIN) 1 MG tablet       Follow up plan: Return in about 6 months (around 12/19/2017), or if symptoms worsen or fail to improve,  for Adult well exam and depression and fasting labs.  Counseling provided for all of the vaccine components No orders of the defined types were placed in this encounter.   Caryl Pina, MD [facility] 06/20/2017, 4:21 PM

## 2017-06-23 NOTE — Telephone Encounter (Signed)
Rx called in to pharmacy. 

## 2017-06-27 ENCOUNTER — Ambulatory Visit: Payer: Self-pay | Admitting: Family Medicine

## 2017-08-31 ENCOUNTER — Ambulatory Visit (INDEPENDENT_AMBULATORY_CARE_PROVIDER_SITE_OTHER): Payer: BLUE CROSS/BLUE SHIELD | Admitting: Family Medicine

## 2017-08-31 ENCOUNTER — Encounter: Payer: Self-pay | Admitting: Family Medicine

## 2017-08-31 VITALS — BP 135/85 | HR 105 | Temp 97.0°F | Ht 72.0 in | Wt 200.0 lb

## 2017-08-31 DIAGNOSIS — M542 Cervicalgia: Secondary | ICD-10-CM | POA: Diagnosis not present

## 2017-08-31 MED ORDER — TIZANIDINE HCL 2 MG PO TABS: 2.0000 mg | ORAL_TABLET | Freq: Four times a day (QID) | ORAL | 0 refills | Status: DC | PRN

## 2017-08-31 MED ORDER — MELOXICAM 15 MG PO TABS: 15.0000 mg | ORAL_TABLET | Freq: Every day | ORAL | 0 refills | Status: DC | PRN

## 2017-08-31 MED ORDER — KETOROLAC TROMETHAMINE 30 MG/ML IJ SOLN
30.0000 mg | Freq: Once | INTRAMUSCULAR | Status: AC
  Administered 2017-08-31: 30 mg via INTRAMUSCULAR

## 2017-08-31 NOTE — Progress Notes (Signed)
Subjective: CC: neck pain PCP: Dettinger, Fransisca Kaufmann, MD NID:[id number] Benjamin Zamora is a 52 y.o. male presenting to clinic today for:  1. Neck pain Patient reports onset of right-sided neck pain about 1.5 months ago.  He denies preceding injury or inciting activity.  He points to the right side at the C4/C5 level as the exact point of pain.  He notes pain is worse with turning his neck and lifting up his right upper extremity.  Denies radiation down the arm.  No associated weakness.  He occasionally has tingling/itching sensation over the dorsum of the right hand opposite of the thenar eminence.  He has used over-the-counter muscle rubs, heat, ice, Excedrin with little improvement in symptoms.  He works for [employer] and use his upper extremities frequently.   ROS: Per HPI  No Known Allergies Past Medical History:  Diagnosis Date  . Anxiety   . Depression   . Drug abuse ([place]) 1995   Stopped in 1995  . Hyperlipidemia     Current Outpatient Medications:  .  clonazePAM (KLONOPIN) 1 MG tablet, Take 1 tablet (1 mg total) by mouth 2 (two) times daily as needed. for anxiety, Disp: 30 tablet, Rfl: 5 .  escitalopram (LEXAPRO) 10 MG tablet, Take 1 tablet (10 mg total) by mouth daily., Disp: 90 tablet, Rfl: 3 Social History   Socioeconomic History  . Marital status: Married    Spouse name: Not on file  . Number of children: Not on file  . Years of education: Not on file  . Highest education level: Not on file  Social Needs  . Financial resource strain: Not on file  . Food insecurity - worry: Not on file  . Food insecurity - inability: Not on file  . Transportation needs - medical: Not on file  . Transportation needs - non-medical: Not on file  Occupational History  . Not on file  Tobacco Use  . Smoking status: Current Every Day Smoker    Packs/day: 1.50    Types: Cigarettes  . Smokeless tobacco: Never Used  Substance and Sexual Activity  . Alcohol use: Yes    Alcohol/week: 3.6 - 4.8 oz      Types: 6 - 8 Cans of beer per week  . Drug use: No  . Sexual activity: Yes  Other Topics Concern  . Not on file  Social History Narrative  . Not on file   Family History  Problem Relation Age of Onset  . Cancer Mother   . Thyroid disease Mother   . Hypertension Father   . Thyroid disease Brother   . Colon cancer Neg Hx     Objective: Office vital signs reviewed. BP 135/85   Pulse (!) 105   Temp (!) 97 F (36.1 C) (Oral)   Ht 6' (1.829 m)   Wt 200 lb (90.7 kg)   BMI 27.12 kg/m   Physical Examination:  General: Awake, alert, well nourished, No acute distress HEENT: Normal Extremities: warm, well perfused, No edema, cyanosis or clubbing; +2 pulses bilaterally MSK: normal gait and normal station  C-spine: Patient has full active range of motion of neck.  He has point tenderness at the level C4-C5 on the right.  He does have mild increased tonicity of the paraspinal muscles in this area.  No midline tenderness to palpation.  No palpable bony abnormalities.  Negative Spurling's. 5/5 upper extremity strength. Skin: No ecchymosis or erythema appreciated.   Neuro: light touch sensation in tact.  Assessment/ Plan: 52 y.o.  male   1. Neck pain Likely degenerative in nature.  He does not demonstrate any radicular symptoms or any red flag symptoms at this time.  He was given a dose of Toradol IM in office.  He was prescribed meloxicam to use once daily as needed for pain.  Muscle relaxer also prescribed to use every 8 hours as needed.  Caution sedation.  Avoid operating heavy machinery and/or driving while taking medication.  Strict return precautions reviewed with the patient.  Reasons for emergent evaluation emergency department he will follow-up as needed.  If symptoms are persistent or worsen, low threshold to obtain x-ray of C-spine and refer to orthopedist for further evaluation and management. - ketorolac (TORADOL) 30 MG/ML injection 30 mg  Meds ordered this encounter   Medications  . ketorolac (TORADOL) 30 MG/ML injection 30 mg  . meloxicam (MOBIC) 15 MG tablet    Sig: Take 1 tablet (15 mg total) by mouth daily as needed for pain. (take with food)    Dispense:  20 tablet    Refill:  0  . tiZANidine (ZANAFLEX) 2 MG tablet    Sig: Take 1 tablet (2 mg total) by mouth every 6 (six) hours as needed for muscle spasms.    Dispense:  30 tablet    Refill:  [place], DO [place] [phone]

## 2017-08-31 NOTE — Patient Instructions (Signed)
As we discussed, this is likely some arthritis in the neck.  We discussed that x-rays could be obtained but likely would not change our treatment plan today.  If your symptoms worsen, you develop weakness in your arm, lightening pains down the arm, numbness and tingling down the arm or worsening neck pain, please return for reevaluation.  You were given a dose of Toradol injection.  This is for pain and inflammation.  You can start the meloxicam before bedtime if you have significant pain.  Make sure that you have eaten prior to using this medication.  Drink plenty of water while taking this medication.  Do not take any other oral NSAIDs while on the meloxicam.  See below.  I have also prescribed you a muscle relaxer that you may use every 8 hours if needed.  They be aware that this may cause sedation and impaired your ability to drive and operate heavy machinery.  Please do not use this while at work or if you need to drive.  You have prescribed a nonsteroidal anti-inflammatory drug (NSAID) today. This will help with ear pain and inflammation. Please do not take any other NSAIDs (ibuprofen/Motrin/Advil, naproxen/Aleve, meloxicam/Mobic, Voltaren/diclofenac). Please make sure to eat a meal when taking this medication.   Caution:  If you have a history of acid reflux/indigestion, I recommend that you take an antacid (such as Prilosec, Prevacid) daily while on the NSAID.  If you have a history of bleeding disorder, gastric ulcer, are on a blood thinner (like warfarin/Coumadin, Xarelto, Eliquis, etc) please do not take NSAID.  If you have ever had a heart attack, you should not take NSAIDs.

## 2017-10-10 DIAGNOSIS — H43812 Vitreous degeneration, left eye: Secondary | ICD-10-CM | POA: Diagnosis not present

## 2017-10-14 DIAGNOSIS — Z01818 Encounter for other preprocedural examination: Secondary | ICD-10-CM | POA: Diagnosis not present

## 2017-10-14 DIAGNOSIS — H33012 Retinal detachment with single break, left eye: Secondary | ICD-10-CM | POA: Diagnosis not present

## 2017-10-14 DIAGNOSIS — H33022 Retinal detachment with multiple breaks, left eye: Secondary | ICD-10-CM | POA: Diagnosis not present

## 2017-10-14 DIAGNOSIS — H33052 Total retinal detachment, left eye: Secondary | ICD-10-CM | POA: Diagnosis not present

## 2017-10-21 DIAGNOSIS — H33012 Retinal detachment with single break, left eye: Secondary | ICD-10-CM | POA: Diagnosis not present

## 2017-10-28 DIAGNOSIS — H3322 Serous retinal detachment, left eye: Secondary | ICD-10-CM | POA: Diagnosis not present

## 2017-11-25 DIAGNOSIS — H3322 Serous retinal detachment, left eye: Secondary | ICD-10-CM | POA: Diagnosis not present

## 2017-12-21 ENCOUNTER — Ambulatory Visit: Payer: BLUE CROSS/BLUE SHIELD | Admitting: Family Medicine

## 2018-02-17 DIAGNOSIS — H33012 Retinal detachment with single break, left eye: Secondary | ICD-10-CM | POA: Diagnosis not present

## 2018-03-16 ENCOUNTER — Ambulatory Visit: Payer: BLUE CROSS/BLUE SHIELD | Admitting: Family Medicine

## 2018-03-16 ENCOUNTER — Encounter: Payer: Self-pay | Admitting: Family Medicine

## 2018-03-16 VITALS — BP 121/75 | HR 84 | Temp 97.0°F | Ht 72.0 in | Wt 202.6 lb

## 2018-03-16 DIAGNOSIS — N529 Male erectile dysfunction, unspecified: Secondary | ICD-10-CM

## 2018-03-16 DIAGNOSIS — Z131 Encounter for screening for diabetes mellitus: Secondary | ICD-10-CM | POA: Diagnosis not present

## 2018-03-16 DIAGNOSIS — Z13220 Encounter for screening for lipoid disorders: Secondary | ICD-10-CM

## 2018-03-16 DIAGNOSIS — F411 Generalized anxiety disorder: Secondary | ICD-10-CM | POA: Diagnosis not present

## 2018-03-16 DIAGNOSIS — Z79899 Other long term (current) drug therapy: Secondary | ICD-10-CM | POA: Insufficient documentation

## 2018-03-16 DIAGNOSIS — F3341 Major depressive disorder, recurrent, in partial remission: Secondary | ICD-10-CM | POA: Diagnosis not present

## 2018-03-16 MED ORDER — ESCITALOPRAM OXALATE 10 MG PO TABS
10.0000 mg | ORAL_TABLET | Freq: Every day | ORAL | 2 refills | Status: DC
Start: 2018-03-16 — End: 2018-09-15

## 2018-03-16 MED ORDER — CLONAZEPAM 1 MG PO TABS: 1.0000 mg | ORAL_TABLET | Freq: Two times a day (BID) | ORAL | 5 refills | Status: DC | PRN

## 2018-03-16 MED ORDER — SILDENAFIL CITRATE 20 MG PO TABS: 20.0000 mg | ORAL_TABLET | ORAL | 3 refills | Status: DC | PRN

## 2018-03-16 NOTE — Progress Notes (Signed)
BP 121/75   Pulse 84   Temp (!) 97 F (36.1 C) (Oral)   Ht 6' (1.829 m)   Wt 202 lb 9.6 oz (91.9 kg)   BMI 27.48 kg/m    Subjective:    Patient ID: Benjamin Zamora, male    DOB: 07/29/1965, 52 y.o.   MRN: [id number]  HPI: Benjamin Zamora is a 52 y.o. male presenting on 03/16/2018 for Depression (6 month follow up); Anxiety; and Erectile Dysfunction (Would like to get back on medication.)   HPI Anxiety depression recheck Patient is coming in today for anxiety and depression recheck.  He is currently taking Lexapro and as needed Klonopin.  He has been trying to cut the Klonopin in half and reduce it has been doing well on that and denies any major issues with anxiety or mood.  He denies any suicidal ideations. Depression screen [facility] 2/9 03/16/2018 08/31/2017 06/20/2017 12/06/2016 11/22/2016  Decreased Interest 0 0 0 0 0  Down, Depressed, Hopeless 0 0 0 0 0  PHQ - 2 Score 0 0 0 0 0  We had signed a controlled substance agreement for Klonopin  Erectile dysfunction Patient has been having intermittent erectile dysfunction, he had an issue years ago that he was fine for quite a bit now is been having some issues over the past few months and would like to get a little bit more the Viagra to see if it can help him through it and then go back to the way that it was.  He denies any pain with intercourse or urinary issues or prostate issues.  Relevant past medical, surgical, family and social history reviewed and updated as indicated. Interim medical history since our last visit reviewed. Allergies and medications reviewed and updated.  Review of Systems  Constitutional: Negative for chills and fever.  Eyes: Negative for visual disturbance.  Respiratory: Negative for shortness of breath and wheezing.   Cardiovascular: Negative for chest pain and leg swelling.  Genitourinary: Negative for decreased urine volume, difficulty urinating and dysuria.  Musculoskeletal: Negative for back pain and  gait problem.  Skin: Negative for rash.  Neurological: Negative for dizziness, weakness, light-headedness and numbness.  Psychiatric/Behavioral: Negative for dysphoric mood, self-injury, sleep disturbance and suicidal ideas. The patient is not nervous/anxious.   All other systems reviewed and are negative.   Per HPI unless specifically indicated above   Allergies as of 03/16/2018   No Known Allergies     Medication List        Accurate as of 03/16/18 11:13 AM. Always use your most recent med list.          clonazePAM 1 MG tablet Commonly known as:  KLONOPIN Take 1 tablet (1 mg total) by mouth 2 (two) times daily as needed. for anxiety   escitalopram 10 MG tablet Commonly known as:  LEXAPRO Take 1 tablet (10 mg total) by mouth daily.   sildenafil 20 MG tablet Commonly known as:  REVATIO Take 1-5 tablets (20-100 mg total) by mouth as needed.          Objective:    BP 121/75   Pulse 84   Temp (!) 97 F (36.1 C) (Oral)   Ht 6' (1.829 m)   Wt 202 lb 9.6 oz (91.9 kg)   BMI 27.48 kg/m   Wt Readings from Last 3 Encounters:  03/16/18 202 lb 9.6 oz (91.9 kg)  08/31/17 200 lb (90.7 kg)  06/20/17 194 lb (88 kg)    Physical Exam  Constitutional: He is oriented to person, place, and time. He appears well-developed and well-nourished. No distress.  Eyes: Conjunctivae are normal. No scleral icterus.  Neck: Neck supple. No thyromegaly present.  Cardiovascular: Normal rate, regular rhythm, normal heart sounds and intact distal pulses.  No murmur heard. Pulmonary/Chest: Effort normal and breath sounds normal. No respiratory distress. He has no wheezes.  Musculoskeletal: Normal range of motion. He exhibits no edema.  Lymphadenopathy:    He has no cervical adenopathy.  Neurological: He is alert and oriented to person, place, and time. Coordination normal.  Skin: Skin is warm and dry. No rash noted. He is not diaphoretic.  Psychiatric: He has a normal mood and affect. His  behavior is normal.  Nursing note and vitals reviewed.       Assessment & Plan:   Problem List Items Addressed This Visit      Other   GAD (generalized anxiety disorder)   Relevant Medications   escitalopram (LEXAPRO) 10 MG tablet   clonazePAM (KLONOPIN) 1 MG tablet   Other Relevant Orders   CBC with Differential/Platelet   ToxASSURE Select 13 (MW), Urine   Depression - Primary   Relevant Medications   escitalopram (LEXAPRO) 10 MG tablet   clonazePAM (KLONOPIN) 1 MG tablet   Other Relevant Orders   CBC with Differential/Platelet   Controlled substance agreement signed    Other Visit Diagnoses    Erectile dysfunction, unspecified erectile dysfunction type       Diabetes mellitus screening       Relevant Orders   CMP14+EGFR   Lipid screening       Relevant Orders   Lipid panel       Follow up plan: Return in about 6 months (around 09/16/2018), or if symptoms worsen or fail to improve, for Anxiety recheck.  Counseling provided for all of the vaccine components Orders Placed This Encounter  Procedures  . CBC with Differential/Platelet  . CMP14+EGFR  . Lipid panel  . ToxASSURE Select 13 (MW), Urine    Caryl Pina, MD [facility] 03/16/2018, 11:13 AM

## 2018-03-17 LAB — CBC WITH DIFFERENTIAL/PLATELET
BASOS ABS: 0.1 10*3/uL (ref 0.0–0.2)
Basos: 1 %
EOS (ABSOLUTE): 0.1 10*3/uL (ref 0.0–0.4)
Eos: 2 %
Hematocrit: 46.3 % (ref 37.5–51.0)
Hemoglobin: 15.7 g/dL (ref 13.0–17.7)
IMMATURE GRANULOCYTES: 1 %
Immature Grans (Abs): 0 10*3/uL (ref 0.0–0.1)
LYMPHS ABS: 2.6 10*3/uL (ref 0.7–3.1)
Lymphs: 30 %
MCH: 32.1 pg (ref 26.6–33.0)
MCHC: 33.9 g/dL (ref 31.5–35.7)
MCV: 95 fL (ref 79–97)
MONOS ABS: 0.9 10*3/uL (ref 0.1–0.9)
Monocytes: 11 %
NEUTROS PCT: 55 %
Neutrophils Absolute: 5 10*3/uL (ref 1.4–7.0)
PLATELETS: 243 10*3/uL (ref 150–450)
RBC: 4.89 x10E6/uL (ref 4.14–5.80)
RDW: 13 % (ref 12.3–15.4)
WBC: 8.7 10*3/uL (ref 3.4–10.8)

## 2018-03-17 LAB — CMP14+EGFR
ALK PHOS: 83 IU/L (ref 39–117)
ALT: 28 IU/L (ref 0–44)
AST: 22 IU/L (ref 0–40)
Albumin/Globulin Ratio: 1.6 (ref 1.2–2.2)
Albumin: 4.4 g/dL (ref 3.5–5.5)
BUN/Creatinine Ratio: 14 (ref 9–20)
BUN: 11 mg/dL (ref 6–24)
Bilirubin Total: 0.3 mg/dL (ref 0.0–1.2)
CALCIUM: 9.4 mg/dL (ref 8.7–10.2)
CO2: 21 mmol/L (ref 20–29)
CREATININE: 0.78 mg/dL (ref 0.76–1.27)
Chloride: 100 mmol/L (ref 96–106)
GFR calc Af Amer: 120 mL/min/{1.73_m2} (ref >59)
GFR, EST NON AFRICAN AMERICAN: 104 mL/min/{1.73_m2} (ref >59)
GLOBULIN, TOTAL: 2.7 g/dL (ref 1.5–4.5)
GLUCOSE: 79 mg/dL (ref 65–99)
Potassium: 4.3 mmol/L (ref 3.5–5.2)
SODIUM: 138 mmol/L (ref 134–144)
Total Protein: 7.1 g/dL (ref 6.0–8.5)

## 2018-03-17 LAB — LIPID PANEL
CHOL/HDL RATIO: 7 ratio — AB (ref 0.0–5.0)
CHOLESTEROL TOTAL: 237 mg/dL — AB (ref 100–199)
HDL: 34 mg/dL — AB (ref >39)
Triglycerides: 583 mg/dL (ref 0–149)

## 2018-03-22 LAB — TOXASSURE SELECT 13 (MW), URINE

## 2018-08-30 ENCOUNTER — Ambulatory Visit: Payer: BLUE CROSS/BLUE SHIELD | Admitting: Family Medicine

## 2018-08-30 VITALS — BP 134/91 | HR 92 | Temp 98.1°F | Ht 72.0 in | Wt 203.0 lb

## 2018-08-30 DIAGNOSIS — Z720 Tobacco use: Secondary | ICD-10-CM | POA: Diagnosis not present

## 2018-08-30 DIAGNOSIS — R42 Dizziness and giddiness: Secondary | ICD-10-CM | POA: Diagnosis not present

## 2018-08-30 DIAGNOSIS — I10 Essential (primary) hypertension: Secondary | ICD-10-CM | POA: Diagnosis not present

## 2018-08-30 MED ORDER — HYDROCHLOROTHIAZIDE 25 MG PO TABS: 12.5000 mg | ORAL_TABLET | Freq: Every day | ORAL | 0 refills | Status: DC

## 2018-08-30 MED ORDER — MECLIZINE HCL 12.5 MG PO TABS: 12.5000 mg | ORAL_TABLET | Freq: Three times a day (TID) | ORAL | 0 refills | Status: DC | PRN

## 2018-08-30 NOTE — Patient Instructions (Signed)
Your neurologic exam was normal.  This may be a hydration versus inner ear issue.  You did have a significant drop in blood pressure with changes of position which may suggest something called orthostatic hypotension.  I want you to drink plenty of clear fluids.  Change positions slowly.  I have given you meclizine to use if needed for severe dizziness.  This may cause sleepiness.  Be mindful of this.  Additionally, your blood pressure is elevated.  I am recommending that we start an antihypertensive called hydrochlorothiazide.  Start out with taking 1/2 tablet daily.  Best time to take this is in the morning as it can increase urine output.  If your blood pressures remain elevated despite use of hydrochlorothiazide, you may increase to a full tablet daily.  Make sure that you are monitoring your blood pressure daily and record this.  Follow-up with Dr. Warrick Parisian in 1 month for recheck of blood pressure.  Dizziness Dizziness is a common problem. It makes you feel unsteady or light-headed. You may feel like you are about to pass out (faint). Dizziness can lead to getting hurt if you stumble or fall. Dizziness can be caused by many things, including:  Medicines.  Not having enough water in your body (dehydration).  Illness. Follow these instructions at home: Eating and drinking   Drink enough fluid to keep your pee (urine) clear or pale yellow. This helps to keep you from getting dehydrated. Try to drink more clear fluids, such as water.  Do not drink alcohol.  Limit how much caffeine you drink or eat, if your doctor tells you to do that.  Limit how much salt (sodium) you drink or eat, if your doctor tells you to do that. Activity   Avoid making quick movements. ? When you stand up from sitting in a chair, steady yourself until you feel okay. ? In the morning, first sit up on the side of the bed. When you feel okay, stand slowly while you hold onto something. Do this until you know that  your balance is fine.  If you need to stand in one place for a long time, move your legs often. Tighten and relax the muscles in your legs while you are standing.  Do not drive or use heavy machinery if you feel dizzy.  Avoid bending down if you feel dizzy. Place items in your home so you can reach them easily without leaning over. Lifestyle  Do not use any products that contain nicotine or tobacco, such as cigarettes and e-cigarettes. If you need help quitting, ask your doctor.  Try to lower your stress level. You can do this by using methods such as yoga or meditation. Talk with your doctor if you need help. General instructions  Watch your dizziness for any changes.  Take over-the-counter and prescription medicines only as told by your doctor. Talk with your doctor if you think that you are dizzy because of a medicine that you are taking.  Tell a friend or a family member that you are feeling dizzy. If he or she notices any changes in your behavior, have this person call your doctor.  Keep all follow-up visits as told by your doctor. This is important. Contact a doctor if:  Your dizziness does not go away.  Your dizziness or light-headedness gets worse.  You feel sick to your stomach (nauseous).  You have trouble hearing.  You have new symptoms.  You are unsteady on your feet.  You feel like the  room is spinning. Get help right away if:  You throw up (vomit) or have watery poop (diarrhea), and you cannot eat or drink anything.  You have trouble: ? Talking. ? Walking. ? Swallowing. ? Using your arms, hands, or legs.  You feel generally weak.  You are not thinking clearly, or you have trouble forming sentences. A friend or family member may notice this.  You have: ? Chest pain. ? Pain in your belly (abdomen). ? Shortness of breath. ? Sweating.  Your vision changes.  You are bleeding.  You have a very bad headache.  You have neck pain or a stiff  neck.  You have a fever. These symptoms may be an emergency. Do not wait to see if the symptoms will go away. Get medical help right away. Call your local emergency services (911 in the [place] Do not drive yourself to the hospital. Summary  Dizziness makes you feel unsteady or light-headed. You may feel like you are about to pass out (faint).  Drink enough fluid to keep your pee (urine) clear or pale yellow. Do not drink alcohol.  Avoid making quick movements if you feel dizzy.  Watch your dizziness for any changes. This information is not intended to replace advice given to you by your health care provider. Make sure you discuss any questions you have with your health care provider. Document Released: 06/24/2011 Document Revised: 07/22/2016 Document Reviewed: 07/22/2016 Elsevier Interactive Patient Education  2019 [employer].

## 2018-08-30 NOTE — Progress Notes (Signed)
Subjective: CC: Dizziness PCP: Dettinger, Fransisca Kaufmann, MD JAS:[id number] Duffus is a 53 y.o. male presenting to clinic today for:  1.  Dizziness Patient reports that he woke up this morning lightheaded and subsequently felt like he was walking towards his right side.  He otherwise has felt very well.  He denies any room spinning, visual disturbance, dysarthria, unilateral weakness or sensation change.  Denies any preceding illness including URI.  No ear pain or discharge.  He has had some slight nausea associated with symptoms but no vomiting.  He has not tried any medications for symptoms.  2.  Elevated blood pressure reading Patient report persistently elevated blood pressures at home as well.  Typically blood pressures run 130s over 90s.  No chest pain or shortness of breath but he has some dizziness as above.  He is also an every day smoker.   ROS: Per HPI  No Known Allergies Past Medical History:  Diagnosis Date  . Anxiety   . Depression   . Drug abuse ([place]) 1995   Stopped in 1995  . Hyperlipidemia     Current Outpatient Medications:  .  clonazePAM (KLONOPIN) 1 MG tablet, Take 1 tablet (1 mg total) by mouth 2 (two) times daily as needed. for anxiety, Disp: 30 tablet, Rfl: 5 .  escitalopram (LEXAPRO) 10 MG tablet, Take 1 tablet (10 mg total) by mouth daily., Disp: 90 tablet, Rfl: 2 .  sildenafil (REVATIO) 20 MG tablet, Take 1-5 tablets (20-100 mg total) by mouth as needed., Disp: 30 tablet, Rfl: 3 Social History   Socioeconomic History  . Marital status: Married    Spouse name: Not on file  . Number of children: Not on file  . Years of education: Not on file  . Highest education level: Not on file  Occupational History  . Not on file  Social Needs  . Financial resource strain: Not on file  . Food insecurity:    Worry: Not on file    Inability: Not on file  . Transportation needs:    Medical: Not on file    Non-medical: Not on file  Tobacco Use  . Smoking status:  Current Every Day Smoker    Packs/day: 1.50    Types: Cigarettes  . Smokeless tobacco: Never Used  Substance and Sexual Activity  . Alcohol use: Yes    Alcohol/week: 6.0 - 8.0 standard drinks    Types: 6 - 8 Cans of beer per week  . Drug use: No  . Sexual activity: Yes  Lifestyle  . Physical activity:    Days per week: Not on file    Minutes per session: Not on file  . Stress: Not on file  Relationships  . Social connections:    Talks on phone: Not on file    Gets together: Not on file    Attends religious service: Not on file    Active member of club or organization: Not on file    Attends meetings of clubs or organizations: Not on file    Relationship status: Not on file  . Intimate partner violence:    Fear of current or ex partner: Not on file    Emotionally abused: Not on file    Physically abused: Not on file    Forced sexual activity: Not on file  Other Topics Concern  . Not on file  Social History Narrative  . Not on file   Family History  Problem Relation Age of Onset  . Cancer Mother   .  Thyroid disease Mother   . Hypertension Father   . Thyroid disease Brother   . Colon cancer Neg Hx     Objective: Office vital signs reviewed. BP (!) 134/91   Pulse 92   Temp 98.1 F (36.7 C) (Oral)   Ht 6' (1.829 m)   Wt 203 lb (92.1 kg)   BMI 27.53 kg/m   Physical Examination:  General: Awake, alert, well nourished, nontoxic. No acute distress HEENT: Normal    Neck: No masses palpated. No lymphadenopathy    Ears: Tympanic membranes intact, normal light reflex, no erythema, no bulging    Eyes: PERRLA (left pupil slightly larger than right this is chronic), extraocular membranes intact, sclera white    Nose: nasal turbinates moist, clear nasal discharge    Throat: moist mucus membranes, no erythema, no tonsillar exudate.  Airway is patent Cardio: regular rate, +2 DP Pulm: normal work of breathing on room air MSK: normal gait and station Neuro: 5/5 UE and LE  Strength and light touch sensation grossly intact, cranial nerves II through XII grossly intact.  Normal upper extremity and lower extremity cerebellar testing.  Negative Romberg.  Normal heel-to-toe walk.  Normal heel walking and tiptoe walking.  No focal neurologic deficits  Orthostatic VS for the past 24 hrs (Last 3 readings):  BP- Lying Pulse- Lying BP- Sitting Pulse- Sitting BP- Standing at 0 minutes Pulse- Standing at 0 minutes  08/30/18 1417 (!) 140/93 88 (!) 152/94 92 (!) 134/91 90    Assessment/ Plan: 53 y.o. male   1. Dizziness Of uncertain etiology.  He was orthostatic with orthostatic vital sign measuring.  He had an almost 20 point drop in his systolic blood pressure from sitting to standing positions.  There was no focal neurologic deficits and nothing on exam to suggest BPPV.  I have recommended that he encourage oral hydration, change positions slowly.  We discussed consideration for compression hose.  I have also prescribed him meclizine to use if needed.  Caution sedation.  I have given him a work excuse for the next few days.  2. Essential hypertension We discussed consideration for initiation of an antihypertensive.  Hydrochlorothiazide 12.5 mg every morning started.  Okay to begin in the next 24 hours following meclizine for dizziness.  We discussed how to titrate the medicine to 25 mg if needed.  He will monitor blood pressures daily and record.  Goal blood pressure less than 140/90 I would like him to follow-up in 1 month, sooner if needed for blood pressure check with his PCP.  3. Tobacco use Cessation encouraged.  Patient is contemplative.   No orders of the defined types were placed in this encounter.  Meds ordered this encounter  Medications  . meclizine (ANTIVERT) 12.5 MG tablet    Sig: Take 1 tablet (12.5 mg total) by mouth 3 (three) times daily as needed for dizziness.    Dispense:  30 tablet    Refill:  0  . hydrochlorothiazide (HYDRODIURIL) 25 MG tablet     Sig: Take 0.5-1 tablets (12.5-25 mg total) by mouth daily.    Dispense:  30 tablet    Refill:  [place], DO [place] [phone]

## 2018-09-08 ENCOUNTER — Ambulatory Visit (INDEPENDENT_AMBULATORY_CARE_PROVIDER_SITE_OTHER): Payer: BLUE CROSS/BLUE SHIELD | Admitting: Family Medicine

## 2018-09-08 ENCOUNTER — Encounter: Payer: Self-pay | Admitting: Family Medicine

## 2018-09-08 VITALS — BP 131/88 | HR 87 | Temp 97.8°F | Ht 72.0 in | Wt 201.0 lb

## 2018-09-08 DIAGNOSIS — I10 Essential (primary) hypertension: Secondary | ICD-10-CM | POA: Diagnosis not present

## 2018-09-08 DIAGNOSIS — Z720 Tobacco use: Secondary | ICD-10-CM | POA: Diagnosis not present

## 2018-09-08 DIAGNOSIS — E782 Mixed hyperlipidemia: Secondary | ICD-10-CM | POA: Diagnosis not present

## 2018-09-08 DIAGNOSIS — M546 Pain in thoracic spine: Secondary | ICD-10-CM

## 2018-09-08 NOTE — Progress Notes (Signed)
Subjective: CC: Follow-up hypertension PCP: Dettinger, Fransisca Kaufmann, MD [name] is a 53 y.o. male presenting to clinic today for:  1.  Hypertension w/ hyperlipidemia Patient with known hyperlipidemia with last lipid panel in August.  He is not currently on any lipid-lowering medication but has cut back on alcohol consumption quite a bit.  He also reports maintaining a fairly well-balanced diet, avoiding fried foods and often cooking at home.  Patient was started on hydrochlorothiazide 12.5 to 25 mg daily on 08/30/2018.  He notes that he has escalated hydrochlorothiazide to the 25 mg daily.  He is also been working on salt reduction and smoking cessation.  He continues to smoke but has reduced the amount that he is smoking daily.  He had his blood pressure checked by the nurse at work today and notes that he was 140s over 125, which caused alarm.  He has been asymptomatic and denies any chest pain, shortness of breath, lower extremity edema.  2.  Thoracic back pain Patient does report left-sided thoracic back pain that has been present for greater than 1 week.  He had associated neck pain but that is since resolved.  He is wondering if he can take NSAIDs given hypertension as above.  He was worried that it was possibly cardiac in nature but denies any nausea, vomiting, dizziness, radiation to the left upper extremity or to the chest.  It is certainly not associated or exacerbated with any particular activities.  He describes it as a dull ache.  He is considering seeing a massage therapist.  No change in activity level or stress lately.   ROS: Per HPI  No Known Allergies Past Medical History:  Diagnosis Date  . Anxiety   . Depression   . Drug abuse ([place]) 1995   Stopped in 1995  . Hyperlipidemia     Current Outpatient Medications:  .  clonazePAM (KLONOPIN) 1 MG tablet, Take 1 tablet (1 mg total) by mouth 2 (two) times daily as needed. for anxiety, Disp: 30 tablet, Rfl: 5 .   escitalopram (LEXAPRO) 10 MG tablet, Take 1 tablet (10 mg total) by mouth daily., Disp: 90 tablet, Rfl: 2 .  hydrochlorothiazide (HYDRODIURIL) 25 MG tablet, Take 0.5-1 tablets (12.5-25 mg total) by mouth daily., Disp: 30 tablet, Rfl: 0 .  meclizine (ANTIVERT) 12.5 MG tablet, Take 1 tablet (12.5 mg total) by mouth 3 (three) times daily as needed for dizziness. (Patient not taking: Reported on 09/08/2018), Disp: 30 tablet, Rfl: 0 .  sildenafil (REVATIO) 20 MG tablet, Take 1-5 tablets (20-100 mg total) by mouth as needed. (Patient not taking: Reported on 09/08/2018), Disp: 30 tablet, Rfl: 3 Social History   Socioeconomic History  . Marital status: Married    Spouse name: Not on file  . Number of children: Not on file  . Years of education: Not on file  . Highest education level: Not on file  Occupational History  . Not on file  Social Needs  . Financial resource strain: Not on file  . Food insecurity:    Worry: Not on file    Inability: Not on file  . Transportation needs:    Medical: Not on file    Non-medical: Not on file  Tobacco Use  . Smoking status: Current Every Day Smoker    Packs/day: 1.50    Types: Cigarettes  . Smokeless tobacco: Never Used  Substance and Sexual Activity  . Alcohol use: Yes    Alcohol/week: 6.0 - 8.0 standard drinks  Types: 6 - 8 Cans of beer per week  . Drug use: No  . Sexual activity: Yes  Lifestyle  . Physical activity:    Days per week: Not on file    Minutes per session: Not on file  . Stress: Not on file  Relationships  . Social connections:    Talks on phone: Not on file    Gets together: Not on file    Attends religious service: Not on file    Active member of club or organization: Not on file    Attends meetings of clubs or organizations: Not on file    Relationship status: Not on file  . Intimate partner violence:    Fear of current or ex partner: Not on file    Emotionally abused: Not on file    Physically abused: Not on file     Forced sexual activity: Not on file  Other Topics Concern  . Not on file  Social History Narrative  . Not on file   Family History  Problem Relation Age of Onset  . Cancer Mother   . Thyroid disease Mother   . Hypertension Father   . Thyroid disease Brother   . Colon cancer Neg Hx     Objective: Office vital signs reviewed. BP 131/88   Pulse 87   Temp 97.8 F (36.6 C) (Oral)   Ht 6' (1.829 m)   Wt 201 lb (91.2 kg)   BMI 27.26 kg/m   Physical Examination:  General: Awake, alert, well nourished, No acute distress HEENT: Normal, sclera white, MMM Cardio: regular rate and rhythm, S1S2 heard, no murmurs appreciated Pulm: clear to auscultation bilaterally, no wheezes, rhonchi or rales; normal work of breathing on room air Extremities: warm, well perfused, No edema, cyanosis or clubbing; +2 pulses bilaterally MSK:   Thoracic spine: Mild tenderness palpation along the left paraspinals at approximately T4 level.  No palpable bony abnormalities.  No midline tenderness palpation.  Patient has full active range of motion.  He has full use of bilateral upper extremities.  The 10-year ASCVD risk score Mikey Bussing DC Brooke Bonito., et al., 2013) is: 19.2%   Values used to calculate the score:     Age: 12 years     Sex: Male     Is Non-Hispanic African American: No     Diabetic: No     Tobacco smoker: Yes     Systolic Blood Pressure: 144 mmHg     Is BP treated: Yes     HDL Cholesterol: 34 mg/dL     Total Cholesterol: 237 mg/dL  Assessment/ Plan: 53 y.o. male   1. Essential hypertension At goal upon recheck.  We discussed proper ways to check blood pressure at work and at home.  Handout was provided.  For now, continue hydrochlorothiazide 25 mg daily.  He will monitor blood pressures closely for the next week and record.  He will contact me in 1 week with recordings and if persistently elevated above 140/90, we will plan to initiate amlodipine 5 mg daily.  He is to keep follow-up as scheduled with  his PCP.  Continue working on salt reduction and smoking cessation.  2. Tobacco use Counseling provided.  He is working on cessation currently  3. Acute left-sided thoracic back pain Of unknown etiology at this point.  Given its acute nature, we have not obtain imaging to further evaluate.  We discussed that if it is persistent despite home therapies and stretches, we should consider obtaining a  chest x-ray to rule out other causes of back pain.  Given current smoking, would like to further evaluate lung fields if persistent issue.  4. Mixed hyperlipidemia ASCVD risk score is 19.2%.  He is currently working on lifestyle modification and has follow-up with his PCP.  Would consider adding a statin given active smoking and now new diagnosis of hypertension.  Will defer this to PCP at follow-up visit.  In the interim, I have given him information on the Mediterranean diet.   No orders of the defined types were placed in this encounter.  No orders of the defined types were placed in this encounter.    Janora Norlander, DO [place] [phone]

## 2018-09-08 NOTE — Patient Instructions (Signed)
We discussed that your goal blood pressure is less than 140/90.  Make sure that you follow the instructions below before proceeding with a blood pressure check.  Monitor the blood pressures and record every day around the same time every day.  Contact me in 1 week with blood pressure readings.  If they remain above 140/90, we will plan to add a medication called amlodipine.  Keep your follow-up with Dr. Warrick Parisian.  How to Take Your Blood Pressure You can take your blood pressure at home with a machine. You may need to check your blood pressure at home:  To check if you have high blood pressure (hypertension).  To check your blood pressure over time.  To make sure your blood pressure medicine is working. Supplies needed: You will need a blood pressure machine, or monitor. You can buy one at a drugstore or online. When choosing one:  Choose one with an arm cuff.  Choose one that wraps around your upper arm. Only one finger should fit between your arm and the cuff.  Do not choose one that measures your blood pressure from your wrist or finger. Your doctor can suggest a monitor. How to prepare Avoid these things for 30 minutes before checking your blood pressure:  Drinking caffeine.  Drinking alcohol.  Eating.  Smoking.  Exercising. Five minutes before checking your blood pressure:  Pee.  Sit in a dining chair. Avoid sitting in a soft couch or armchair.  Be quiet. Do not talk. How to take your blood pressure Follow the instructions that came with your machine. If you have a digital blood pressure monitor, these may be the instructions: 1. Sit up straight. 2. Place your feet on the floor. Do not cross your ankles or legs. 3. Rest your left arm at the level of your heart. You may rest it on a table, desk, or chair. 4. Pull up your shirt sleeve. 5. Wrap the blood pressure cuff around the upper part of your left arm. The cuff should be 1 inch (2.5 cm) above your elbow. It is best  to wrap the cuff around bare skin. 6. Fit the cuff snugly around your arm. You should be able to place only one finger between the cuff and your arm. 7. Put the cord inside the groove of your elbow. 8. Press the power button. 9. Sit quietly while the cuff fills with air and loses air. 10. Write down the numbers on the screen. 11. Wait 2-3 minutes and then repeat steps 1-10. What do the numbers mean? Two numbers make up your blood pressure. The first number is called systolic pressure. The second is called diastolic pressure. An example of a blood pressure reading is "120 over 80" (or 120/80). If you are an adult and do not have a medical condition, use this guide to find out if your blood pressure is normal: Normal  First number: below 120.  Second number: below 80. Elevated  First number: 120-129.  Second number: below 80. Hypertension stage 1  First number: 130-139.  Second number: 80-89. Hypertension stage 2  First number: 140 or above.  Second number: 53 or above. Your blood pressure is above normal even if only the top or bottom number is above normal. Follow these instructions at home:  Check your blood pressure as often as your doctor tells you to.  Take your monitor to your next doctor's appointment. Your doctor will: ? Make sure you are using it correctly. ? Make sure it is  working right.  Make sure you understand what your blood pressure numbers should be.  Tell your doctor if your medicines are causing side effects. Contact a doctor if:  Your blood pressure keeps being high. Get help right away if:  Your first blood pressure number is higher than 180.  Your second blood pressure number is higher than 120. This information is not intended to replace advice given to you by your health care provider. Make sure you discuss any questions you have with your health care provider. Document Released: 06/17/2008 Document Revised: 06/02/2016 Document Reviewed:  12/12/2015 Elsevier Interactive Patient Education  2019 [place] refers to food and lifestyle choices that are based on the traditions of countries located on the [employer]. This way of eating has been shown to help prevent certain conditions and improve outcomes for people who have chronic diseases, like kidney disease and heart disease. What are tips for following this plan? Lifestyle  Cook and eat meals together with your family, when possible.  Drink enough fluid to keep your urine clear or pale yellow.  Be physically active every day. This includes: ? Aerobic exercise like running or swimming. ? Leisure activities like gardening, walking, or housework.  Get 7-8 hours of sleep each night.  If recommended by your health care provider, drink red wine in moderation. This means 1 glass a day for nonpregnant women and 2 glasses a day for men. A glass of wine equals 5 oz (150 mL). Reading food labels   Check the serving size of packaged foods. For foods such as rice and pasta, the serving size refers to the amount of cooked product, not dry.  Check the total fat in packaged foods. Avoid foods that have saturated fat or trans fats.  Check the ingredients list for added sugars, such as corn syrup. Shopping  At the grocery store, buy most of your food from the areas near the walls of the store. This includes: ? Fresh fruits and vegetables (produce). ? Grains, beans, nuts, and seeds. Some of these may be available in unpackaged forms or large amounts (in bulk). ? Fresh seafood. ? Poultry and eggs. ? Low-fat dairy products.  Buy whole ingredients instead of prepackaged foods.  Buy fresh fruits and vegetables in-season from local farmers markets.  Buy frozen fruits and vegetables in resealable bags.  If you do not have access to quality fresh seafood, buy precooked frozen shrimp or canned fish, such as tuna, salmon, or  sardines.  Buy small amounts of raw or cooked vegetables, salads, or olives from the deli or salad bar at your store.  Stock your pantry so you always have certain foods on hand, such as olive oil, canned tuna, canned tomatoes, rice, pasta, and beans. Cooking  Cook foods with extra-virgin olive oil instead of using butter or other vegetable oils.  Have meat as a side dish, and have vegetables or grains as your main dish. This means having meat in small portions or adding small amounts of meat to foods like pasta or stew.  Use beans or vegetables instead of meat in common dishes like chili or lasagna.  Experiment with different cooking methods. Try roasting or broiling vegetables instead of steaming or sauteing them.  Add frozen vegetables to soups, stews, pasta, or rice.  Add nuts or seeds for added healthy fat at each meal. You can add these to yogurt, salads, or vegetable dishes.  Marinate fish or vegetables using olive  oil, lemon juice, garlic, and fresh herbs. Meal planning   Plan to eat 1 vegetarian meal one day each week. Try to work up to 2 vegetarian meals, if possible.  Eat seafood 2 or more times a week.  Have healthy snacks readily available, such as: ? Vegetable sticks with hummus. ? [place] yogurt. ? Fruit and nut trail mix.  Eat balanced meals throughout the week. This includes: ? Fruit: 2-3 servings a day ? Vegetables: 4-5 servings a day ? Low-fat dairy: 2 servings a day ? Fish, poultry, or lean meat: 1 serving a day ? Beans and legumes: 2 or more servings a week ? Nuts and seeds: 1-2 servings a day ? Whole grains: 6-8 servings a day ? Extra-virgin olive oil: 3-4 servings a day  Limit red meat and sweets to only a few servings a month What are my food choices?  Mediterranean diet ? Recommended ? Grains: Whole-grain pasta. Brown rice. Bulgar wheat. Polenta. Couscous. Whole-wheat bread. [place]. ? Vegetables: Artichokes. Beets. Broccoli. Cabbage.  Carrots. Eggplant. Green beans. Chard. Kale. Spinach. Onions. Leeks. Peas. Squash. Tomatoes. Peppers. Radishes. ? Fruits: Apples. Apricots. Avocado. Berries. Bananas. Cherries. Dates. Figs. Grapes. Lemons. Melon. Oranges. Peaches. Plums. Pomegranate. ? Meats and other protein foods: Beans. Almonds. Sunflower seeds. Pine nuts. Peanuts. [place]. Salmon. Scallops. Shrimp. [place]. Tilapia. Clams. Oysters. Eggs. ? Dairy: Low-fat milk. Cheese. Greek yogurt. ? Beverages: Water. Red wine. Herbal tea. ? Fats and oils: Extra virgin olive oil. Avocado oil. Grape seed oil. ? Sweets and desserts: [place] yogurt with honey. Baked apples. Poached pears. Trail mix. ? Seasoning and other foods: Basil. Cilantro. Coriander. Cumin. Mint. Parsley. Sage. Rosemary. Tarragon. Garlic. Oregano. Thyme. Pepper. Balsalmic vinegar. Tahini. Hummus. Tomato sauce. Olives. Mushrooms. ? Limit these ? Grains: Prepackaged pasta or rice dishes. Prepackaged cereal with added sugar. ? Vegetables: Deep fried potatoes (french fries). ? Fruits: Fruit canned in syrup. ? Meats and other protein foods: Beef. Pork. Lamb. Poultry with skin. Hot dogs. [place] Salines. ? Dairy: Ice cream. Sour cream. Whole milk. ? Beverages: Juice. Sugar-sweetened soft drinks. Beer. Liquor and spirits. ? Fats and oils: Butter. Canola oil. Vegetable oil. Beef fat (tallow). Lard. ? Sweets and desserts: Cookies. Cakes. Pies. Candy. ? Seasoning and other foods: Mayonnaise. Premade sauces and marinades. ? The items listed may not be a complete list. Talk with your dietitian about what dietary choices are right for you. Summary  The Mediterranean diet includes both food and lifestyle choices.  Eat a variety of fresh fruits and vegetables, beans, nuts, seeds, and whole grains.  Limit the amount of red meat and sweets that you eat.  Talk with your health care provider about whether it is safe for you to drink red wine in moderation. This means 1 glass a day for nonpregnant women  and 2 glasses a day for men. A glass of wine equals 5 oz (150 mL). This information is not intended to replace advice given to you by your health care provider. Make sure you discuss any questions you have with your health care provider. Document Released: 02/26/2016 Document Revised: 03/30/2016 Document Reviewed: 02/26/2016 Elsevier Interactive Patient Education  2019 [employer].

## 2018-09-15 ENCOUNTER — Encounter: Payer: Self-pay | Admitting: Family Medicine

## 2018-09-15 ENCOUNTER — Ambulatory Visit: Payer: BLUE CROSS/BLUE SHIELD | Admitting: Family Medicine

## 2018-09-15 VITALS — BP 128/83 | HR 86 | Temp 97.6°F | Ht 72.0 in | Wt 203.6 lb

## 2018-09-15 DIAGNOSIS — I10 Essential (primary) hypertension: Secondary | ICD-10-CM | POA: Diagnosis not present

## 2018-09-15 DIAGNOSIS — F3341 Major depressive disorder, recurrent, in partial remission: Secondary | ICD-10-CM

## 2018-09-15 DIAGNOSIS — Z9189 Other specified personal risk factors, not elsewhere classified: Secondary | ICD-10-CM | POA: Diagnosis not present

## 2018-09-15 DIAGNOSIS — E782 Mixed hyperlipidemia: Secondary | ICD-10-CM

## 2018-09-15 DIAGNOSIS — F411 Generalized anxiety disorder: Secondary | ICD-10-CM

## 2018-09-15 MED ORDER — ESCITALOPRAM OXALATE 10 MG PO TABS: 10.0000 mg | ORAL_TABLET | Freq: Every day | ORAL | 3 refills | Status: DC

## 2018-09-15 MED ORDER — LISINOPRIL-HYDROCHLOROTHIAZIDE 20-25 MG PO TABS: 1.0000 | ORAL_TABLET | Freq: Every day | ORAL | 3 refills | Status: DC

## 2018-09-15 MED ORDER — ATORVASTATIN CALCIUM 20 MG PO TABS: 20.0000 mg | ORAL_TABLET | Freq: Every day | ORAL | 3 refills | Status: DC

## 2018-09-15 MED ORDER — CLONAZEPAM 1 MG PO TABS: 1.0000 mg | ORAL_TABLET | Freq: Two times a day (BID) | ORAL | 5 refills | Status: DC | PRN

## 2018-09-15 NOTE — Patient Instructions (Signed)
The 10-year ASCVD risk score Mikey Bussing DC Brooke Bonito., et al., 2013) is: 18.5%   Values used to calculate the score:     Age: 53 years     Sex: Male     Is Non-Hispanic African American: No     Diabetic: No     Tobacco smoker: Yes     Systolic Blood Pressure: 099 mmHg     Is BP treated: Yes     HDL Cholesterol: 34 mg/dL     Total Cholesterol: 237 mg/dL

## 2018-09-15 NOTE — Progress Notes (Signed)
BP 128/83   Pulse 86   Temp 97.6 F (36.4 C) (Oral)   Ht 6' (1.829 m)   Wt 203 lb 9.6 oz (92.4 kg)   BMI 27.61 kg/m    Subjective:    Patient ID: Benjamin Zamora, male    DOB: 1965-11-04, 53 y.o.   MRN: [id number]  HPI: Benjamin Zamora is a 53 y.o. male presenting on 09/15/2018 for Hypertension (6 month follow up)   HPI Hypertension Patient is currently on hydrochlorothiazide, and their blood pressure today is 128/83 but at home he has been running frequently in the 140s over 90s to low 100s.. Patient denies any lightheadedness or dizziness. Patient denies headaches, blurred vision, chest pains, shortness of breath, or weakness. Denies any side effects from medication and is content with current medication.  Hyperlipidemia Patient is coming in for recheck of his hyperlipidemia. The patient is currently taking no medication. They deny any issues with myalgias or history of liver damage from it. They deny any focal numbness or weakness or chest pain.    Patient is coming in today for anxiety depression recheck.  He says that he has been doing very well on his Lexapro and his clonazepam and he tries using only as needed.  He says his anxiety has been well after That he has been reducing his alcohol intake to only 2 drinks a day which has been helping with this.  Relevant past medical, surgical, family and social history reviewed and updated as indicated. Interim medical history since our last visit reviewed. Allergies and medications reviewed and updated.  Review of Systems  Constitutional: Negative for chills and fever.  Eyes: Negative for discharge.  Respiratory: Negative for shortness of breath and wheezing.   Cardiovascular: Negative for chest pain and leg swelling.  Musculoskeletal: Negative for back pain and gait problem.  Skin: Negative for rash.  Neurological: Negative for dizziness, weakness and light-headedness.  All other systems reviewed and are  negative.   Per HPI unless specifically indicated above   Allergies as of 09/15/2018   No Known Allergies     Medication List       Accurate as of September 15, 2018 11:36 AM. Always use your most recent med list.        clonazePAM 1 MG tablet Commonly known as:  KLONOPIN Take 1 tablet (1 mg total) by mouth 2 (two) times daily as needed. for anxiety   escitalopram 10 MG tablet Commonly known as:  LEXAPRO Take 1 tablet (10 mg total) by mouth daily.   meclizine 12.5 MG tablet Commonly known as:  ANTIVERT Take 1 tablet (12.5 mg total) by mouth 3 (three) times daily as needed for dizziness.   sildenafil 20 MG tablet Commonly known as:  REVATIO Take 1-5 tablets (20-100 mg total) by mouth as needed.          Objective:    BP 128/83   Pulse 86   Temp 97.6 F (36.4 C) (Oral)   Ht 6' (1.829 m)   Wt 203 lb 9.6 oz (92.4 kg)   BMI 27.61 kg/m   Wt Readings from Last 3 Encounters:  09/15/18 203 lb 9.6 oz (92.4 kg)  09/08/18 201 lb (91.2 kg)  08/30/18 203 lb (92.1 kg)    Physical Exam Vitals signs and nursing note reviewed.  Constitutional:      General: He is not in acute distress.    Appearance: He is well-developed. He is not diaphoretic.  Eyes:  General: No scleral icterus.    Conjunctiva/sclera: Conjunctivae normal.  Neck:     Musculoskeletal: Neck supple.     Thyroid: No thyromegaly.  Cardiovascular:     Rate and Rhythm: Normal rate and regular rhythm.     Heart sounds: Normal heart sounds. No murmur.  Pulmonary:     Effort: Pulmonary effort is normal. No respiratory distress.     Breath sounds: Normal breath sounds. No wheezing.  Lymphadenopathy:     Cervical: No cervical adenopathy.  Skin:    General: Skin is warm and dry.     Findings: No rash.  Neurological:     Mental Status: He is alert and oriented to person, place, and time.     Coordination: Coordination normal.  Psychiatric:        Behavior: Behavior normal.         Assessment &  Plan:   Problem List Items Addressed This Visit      Cardiovascular and Mediastinum   Essential hypertension - Primary   Relevant Medications   atorvastatin (LIPITOR) 20 MG tablet   lisinopril-hydrochlorothiazide (PRINZIDE,ZESTORETIC) 20-25 MG tablet   Other Relevant Orders   CMP14+EGFR     Other   GAD (generalized anxiety disorder)   Relevant Medications   escitalopram (LEXAPRO) 10 MG tablet   clonazePAM (KLONOPIN) 1 MG tablet   Depression   Relevant Medications   escitalopram (LEXAPRO) 10 MG tablet   clonazePAM (KLONOPIN) 1 MG tablet   Mixed hyperlipidemia   Relevant Medications   atorvastatin (LIPITOR) 20 MG tablet   lisinopril-hydrochlorothiazide (PRINZIDE,ZESTORETIC) 20-25 MG tablet   Other Relevant Orders   Lipid panel    Other Visit Diagnoses    [place] cardiac risk 10-20% in next 10 years          The 10-year ASCVD risk score Mikey Bussing DC Jr., et al., 2013) is: 16%   Values used to calculate the score:     Age: 60 years     Sex: Male     Is Non-Hispanic African American: No     Diabetic: No     Tobacco smoker: Yes     Systolic Blood Pressure: 771 mmHg     Is BP treated: No     HDL Cholesterol: 34 mg/dL     Total Cholesterol: 237 mg/dL  Follow up plan: Return in about 4 weeks (around 10/13/2018), or if symptoms worsen or fail to improve, for Hypertension.  Counseling provided for all of the vaccine components Orders Placed This Encounter  Procedures  . Lipid panel  . [place], MD [facility] 09/15/2018, 11:36 AM

## 2018-09-16 LAB — CMP14+EGFR
A/G RATIO: 1.8 (ref 1.2–2.2)
ALK PHOS: 67 IU/L (ref 39–117)
ALT: 24 IU/L (ref 0–44)
AST: 20 IU/L (ref 0–40)
Albumin: 4.5 g/dL (ref 3.8–4.9)
BILIRUBIN TOTAL: 0.3 mg/dL (ref 0.0–1.2)
BUN/Creatinine Ratio: 17 (ref 9–20)
BUN: 15 mg/dL (ref 6–24)
CHLORIDE: 100 mmol/L (ref 96–106)
CO2: 20 mmol/L (ref 20–29)
Calcium: 9.7 mg/dL (ref 8.7–10.2)
Creatinine, Ser: 0.89 mg/dL (ref 0.76–1.27)
GFR calc non Af Amer: 98 mL/min/{1.73_m2} (ref >59)
GFR, EST AFRICAN AMERICAN: 113 mL/min/{1.73_m2} (ref >59)
Globulin, Total: 2.5 g/dL (ref 1.5–4.5)
Glucose: 81 mg/dL (ref 65–99)
POTASSIUM: 4 mmol/L (ref 3.5–5.2)
Sodium: 138 mmol/L (ref 134–144)
TOTAL PROTEIN: 7 g/dL (ref 6.0–8.5)

## 2018-09-16 LAB — LIPID PANEL
Chol/HDL Ratio: 5.9 ratio — ABNORMAL HIGH (ref 0.0–5.0)
Cholesterol, Total: 207 mg/dL — ABNORMAL HIGH (ref 100–199)
HDL: 35 mg/dL — AB (ref >39)
LDL CALC: 121 mg/dL — AB (ref 0–99)
TRIGLYCERIDES: 256 mg/dL — AB (ref 0–149)
VLDL CHOLESTEROL CAL: 51 mg/dL — AB (ref 5–40)

## 2018-10-13 ENCOUNTER — Encounter: Payer: Self-pay | Admitting: Family Medicine

## 2018-10-13 ENCOUNTER — Other Ambulatory Visit: Payer: Self-pay

## 2018-10-13 ENCOUNTER — Ambulatory Visit (INDEPENDENT_AMBULATORY_CARE_PROVIDER_SITE_OTHER): Payer: BLUE CROSS/BLUE SHIELD | Admitting: Family Medicine

## 2018-10-13 DIAGNOSIS — E782 Mixed hyperlipidemia: Secondary | ICD-10-CM

## 2018-10-13 DIAGNOSIS — F3341 Major depressive disorder, recurrent, in partial remission: Secondary | ICD-10-CM | POA: Diagnosis not present

## 2018-10-13 DIAGNOSIS — I10 Essential (primary) hypertension: Secondary | ICD-10-CM | POA: Diagnosis not present

## 2018-10-13 NOTE — Progress Notes (Signed)
Virtual Visit via telephone Note  I connected with Benjamin Zamora on 10/13/18 at 1252 by telephone and verified that I am speaking with the correct person using two identifiers. Benjamin Zamora is currently located at work and no other people are currently with her during visit. The provider, Fransisca Kaufmann Finbar Nippert, MD is located in their office at time of visit.  Call ended at 1302  I discussed the limitations, risks, security and privacy concerns of performing an evaluation and management service by telephone and the availability of in person appointments. I also discussed with the patient that there may be a patient responsible charge related to this service. The patient expressed understanding and agreed to proceed.   History and Present Illness: Hypertension Patient is currently on lisinopril hydrochlorothiazide, and their blood pressure at home today was 112/60 but he said when he was taking the blood pressure medication just over the past couple weeks it was as low as 80/50 and 80 just felt not right.. Patient denies headaches, blurred vision, chest pains, shortness of breath, or weakness. Denies any side effects from medication and is content with current medication. Blood pressure lower and making him feel weird and he is starting to cut it into a quarter and and has changed diet.  He did not describe it as lightheadedness but he says he may have felt a little dizzy with it but he just kind of felt no energy and just felt weird  Anxiety and depression Still doing great with lexapro and Klonopin is still doing well to sleep at night and taking only a half.  He says is doing well for him still and he denies any major changes with that will continue with that.  Patient denies any suicidal ideations or thoughts of hurting himself.  Hyperlipidemia Patient is coming in for recheck of his hyperlipidemia. The patient is currently taking Lipitor. They deny any issues with myalgias or history  of liver damage from it. They deny any focal numbness or weakness or chest pain.   No diagnosis found.  Outpatient Encounter Medications as of 10/13/2018  Medication Sig  . atorvastatin (LIPITOR) 20 MG tablet Take 1 tablet (20 mg total) by mouth daily.  . clonazePAM (KLONOPIN) 1 MG tablet Take 1 tablet (1 mg total) by mouth 2 (two) times daily as needed. for anxiety  . escitalopram (LEXAPRO) 10 MG tablet Take 1 tablet (10 mg total) by mouth daily.  Marland Kitchen lisinopril-hydrochlorothiazide (PRINZIDE,ZESTORETIC) 20-25 MG tablet Take 1 tablet by mouth daily.  . meclizine (ANTIVERT) 12.5 MG tablet Take 1 tablet (12.5 mg total) by mouth 3 (three) times daily as needed for dizziness.  . sildenafil (REVATIO) 20 MG tablet Take 1-5 tablets (20-100 mg total) by mouth as needed.   No facility-administered encounter medications on file as of 10/13/2018.     Review of Systems  Constitutional: Positive for fatigue. Negative for chills and fever.  Eyes: Negative for visual disturbance.  Respiratory: Negative for shortness of breath and wheezing.   Cardiovascular: Negative for chest pain and leg swelling.  Musculoskeletal: Negative for back pain and gait problem.  Skin: Negative for rash.  Neurological: Positive for dizziness and light-headedness. Negative for weakness and numbness.  Psychiatric/Behavioral: Negative for dysphoric mood, self-injury, sleep disturbance and suicidal ideas. The patient is not nervous/anxious.   All other systems reviewed and are negative.   Observations/Objective: Patient sounds comfortable on the phone and content and not in distress  Assessment and Plan: Problem List Items Addressed This Visit  Cardiovascular and Mediastinum   Essential hypertension     Other   Depression - Primary   Mixed hyperlipidemia       Follow Up Instructions:  Patient was having lightheadedness and dizziness and a half this pill for blood pressure and then he quartered it and his blood  pressure still running in the 1 teens to 120s, I recommended to go ahead and stop it.  Continue the diet and exercise and lifestyle changes that he is doing  Continue Lipitor and continue Lexapro and clonazepam, see back in 5 months   I discussed the assessment and treatment plan with the patient. The patient was provided an opportunity to ask questions and all were answered. The patient agreed with the plan and demonstrated an understanding of the instructions.   The patient was advised to call back or seek an in-person evaluation if the symptoms worsen or if the condition fails to improve as anticipated.  The above assessment and management plan was discussed with the patient. The patient verbalized understanding of and has agreed to the management plan. Patient is aware to call the clinic if symptoms persist or worsen. Patient is aware when to return to the clinic for a follow-up visit. Patient educated on when it is appropriate to go to the emergency department.    I provided 10 minutes of non-face-to-face time during this encounter.    Worthy Rancher, MD

## 2019-05-17 ENCOUNTER — Encounter: Payer: Self-pay | Admitting: Family Medicine

## 2019-05-17 ENCOUNTER — Ambulatory Visit (INDEPENDENT_AMBULATORY_CARE_PROVIDER_SITE_OTHER): Payer: BC Managed Care – PPO | Admitting: Family Medicine

## 2019-05-17 DIAGNOSIS — I10 Essential (primary) hypertension: Secondary | ICD-10-CM | POA: Diagnosis not present

## 2019-05-17 DIAGNOSIS — F411 Generalized anxiety disorder: Secondary | ICD-10-CM | POA: Diagnosis not present

## 2019-05-17 DIAGNOSIS — F3341 Major depressive disorder, recurrent, in partial remission: Secondary | ICD-10-CM | POA: Diagnosis not present

## 2019-05-17 DIAGNOSIS — E782 Mixed hyperlipidemia: Secondary | ICD-10-CM | POA: Diagnosis not present

## 2019-05-17 MED ORDER — CLONAZEPAM 1 MG PO TABS: 1.0000 mg | ORAL_TABLET | Freq: Two times a day (BID) | ORAL | 5 refills | Status: DC | PRN

## 2019-05-17 NOTE — Progress Notes (Signed)
Virtual Visit via telephone Note  I connected with Benjamin Zamora on 05/17/19 at 1249 by telephone and verified that I am speaking with the correct person using two identifiers. Benjamin Zamora is currently located at home and no other people are currently with her during visit. The provider, Fransisca Kaufmann Druscilla Petsch, MD is located in their office at time of visit.  Call ended at 1302  I discussed the limitations, risks, security and privacy concerns of performing an evaluation and management service by telephone and the availability of in person appointments. I also discussed with the patient that there may be a patient responsible charge related to this service. The patient expressed understanding and agreed to proceed.   History and Present Illness: Hypertension Patient is currently on no medication and is diet controlled, and their blood pressure today is 120/86. Patient denies any lightheadedness or dizziness. Patient denies headaches, blurred vision, chest pains, shortness of breath, or weakness. Denies any side effects from medication and is content with current medication.   Hyperlipidemia Patient is coming in for recheck of his hyperlipidemia. The patient is currently taking atorvastatin. They deny any issues with myalgias or history of liver damage from it. They deny any focal numbness or weakness or chest pain.   Anxiety Patient is calling in for anxiety recheck and he is out hiking. He is taking klonazepam and lexapro.  Patient says his anxiety is doing very well despite everything going on and he takes clonazepam at night and the Lexapro during the day and he has been trying to get outdoors and go hiking more and not been helping a lot.  No diagnosis found.  Outpatient Encounter Medications as of 05/17/2019  Medication Sig  . atorvastatin (LIPITOR) 20 MG tablet Take 1 tablet (20 mg total) by mouth daily.  . clonazePAM (KLONOPIN) 1 MG tablet Take 1 tablet (1 mg total) by  mouth 2 (two) times daily as needed. for anxiety  . escitalopram (LEXAPRO) 10 MG tablet Take 1 tablet (10 mg total) by mouth daily.  Marland Kitchen lisinopril-hydrochlorothiazide (PRINZIDE,ZESTORETIC) 20-25 MG tablet Take 1 tablet by mouth daily.  . meclizine (ANTIVERT) 12.5 MG tablet Take 1 tablet (12.5 mg total) by mouth 3 (three) times daily as needed for dizziness.  . sildenafil (REVATIO) 20 MG tablet Take 1-5 tablets (20-100 mg total) by mouth as needed.   No facility-administered encounter medications on file as of 05/17/2019.     Review of Systems  Constitutional: Negative for chills and fever.  Eyes: Negative for visual disturbance.  Respiratory: Negative for shortness of breath and wheezing.   Cardiovascular: Negative for chest pain and leg swelling.  Musculoskeletal: Negative for back pain and gait problem.  Skin: Negative for rash.  Psychiatric/Behavioral: Negative for dysphoric mood, self-injury, sleep disturbance and suicidal ideas. The patient is not nervous/anxious.   All other systems reviewed and are negative.   Observations/Objective: Patient sounds comfortable and in no acute distress  Assessment and Plan: Problem List Items Addressed This Visit      Cardiovascular and Mediastinum   Essential hypertension - Primary   Relevant Orders   CMP14+EGFR     Other   GAD (generalized anxiety disorder)   Relevant Medications   clonazePAM (KLONOPIN) 1 MG tablet   Other Relevant Orders   CBC with Differential/Platelet   Depression   Relevant Medications   clonazePAM (KLONOPIN) 1 MG tablet   Mixed hyperlipidemia   Relevant Orders   Lipid panel       Follow Up  Instructions: Follow-up in 4 months in February    I discussed the assessment and treatment plan with the patient. The patient was provided an opportunity to ask questions and all were answered. The patient agreed with the plan and demonstrated an understanding of the instructions.   The patient was advised to call  back or seek an in-person evaluation if the symptoms worsen or if the condition fails to improve as anticipated.  The above assessment and management plan was discussed with the patient. The patient verbalized understanding of and has agreed to the management plan. Patient is aware to call the clinic if symptoms persist or worsen. Patient is aware when to return to the clinic for a follow-up visit. Patient educated on when it is appropriate to go to the emergency department.    I provided 13 minutes of non-face-to-face time during this encounter.    Worthy Rancher, MD

## 2019-08-09 ENCOUNTER — Encounter: Payer: Self-pay | Admitting: Family Medicine

## 2019-08-09 ENCOUNTER — Ambulatory Visit: Payer: BC Managed Care – PPO | Admitting: Family Medicine

## 2019-08-09 ENCOUNTER — Other Ambulatory Visit: Payer: Self-pay

## 2019-08-09 VITALS — BP 138/98 | HR 87 | Temp 99.3°F | Ht 72.0 in | Wt 213.0 lb

## 2019-08-09 DIAGNOSIS — M545 Low back pain, unspecified: Secondary | ICD-10-CM

## 2019-08-09 MED ORDER — METHOCARBAMOL 500 MG PO TABS: 500.0000 mg | ORAL_TABLET | Freq: Three times a day (TID) | ORAL | 1 refills | Status: DC | PRN

## 2019-08-09 NOTE — Progress Notes (Signed)
Assessment & Plan:  1. Acute bilateral low back pain without sciatica - Education provided on low back pain exercises.  Encourage patient to take Ibuprofen 400-600 mg WITH Tylenol 500 mg every 6-8 hours as needed for pain.  Also encouraged a combination of use of heating pad, muscle rubs, a muscle relaxer, and Lidoderm patches.  Offered patient steroid injection today but he declined. - methocarbamol (ROBAXIN) 500 MG tablet; Take 1 tablet (500 mg total) by mouth every 8 (eight) hours as needed for muscle spasms.  Dispense: 30 tablet; Refill: 1   Follow up plan: Return if symptoms worsen or fail to improve.  Hendricks Limes, MSN, APRN, FNP-C [facility]  Subjective:   Patient ID: Benjamin Zamora, male    DOB: October 01, 1965, 54 y.o.   MRN: [id number]  HPI: Benjamin Zamora is a 54 y.o. male presenting on 08/09/2019 for Back Pain  Back Pain: Patient presents for presents evaluation of low back problems.  Symptoms have been present for 3 days and include pain in lumbar spine (aching in character; 7/10 in severity). Initial inciting event: moving furnature. Exacerbating factors identifiable by patient are bending over, raising up, getting up into Jeep. Treatments so far initiated by patient: Ibuprofen 400 mg. Previous lower back problems: none.   Patient has also been experiencing numbness and tingling that starts in his right shoulder and goes down to his thumb for the past two and a half months. He denies any neck pain/issues.    ROS: Negative unless specifically indicated above in HPI.   Relevant past medical history reviewed and updated as indicated.   Allergies and medications reviewed and updated.   Current Outpatient Medications:    atorvastatin (LIPITOR) 20 MG tablet, Take 1 tablet (20 mg total) by mouth daily., Disp: 90 tablet, Rfl: 3   clonazePAM (KLONOPIN) 1 MG tablet, Take 1 tablet (1 mg total) by mouth 2 (two) times daily as needed. for anxiety,  Disp: 30 tablet, Rfl: 5   escitalopram (LEXAPRO) 10 MG tablet, Take 1 tablet (10 mg total) by mouth daily., Disp: 90 tablet, Rfl: 3   sildenafil (REVATIO) 20 MG tablet, Take 1-5 tablets (20-100 mg total) by mouth as needed., Disp: 30 tablet, Rfl: 3   meclizine (ANTIVERT) 12.5 MG tablet, Take 1 tablet (12.5 mg total) by mouth 3 (three) times daily as needed for dizziness. (Patient not taking: Reported on 08/09/2019), Disp: 30 tablet, Rfl: 0   methocarbamol (ROBAXIN) 500 MG tablet, Take 1 tablet (500 mg total) by mouth every 8 (eight) hours as needed for muscle spasms., Disp: 30 tablet, Rfl: 1  No Known Allergies  Objective:   BP (!) 138/98    Pulse 87    Temp 99.3 F (37.4 C) (Temporal)    Ht 6' (1.829 m)    Wt 213 lb (96.6 kg)    SpO2 97%    BMI 28.89 kg/m    Physical Exam Vitals reviewed.  Constitutional:      General: He is not in acute distress.    Appearance: Normal appearance. He is not ill-appearing, toxic-appearing or diaphoretic.  HENT:     Head: Normocephalic and atraumatic.  Eyes:     General: No scleral icterus.       Right eye: No discharge.        Left eye: No discharge.     Conjunctiva/sclera: Conjunctivae normal.  Cardiovascular:     Rate and Rhythm: Normal rate.  Pulmonary:     Effort: Pulmonary effort is  normal. No respiratory distress.  Musculoskeletal:        General: Normal range of motion.     Cervical back: Normal range of motion.     Lumbar back: Tenderness present. No swelling, edema, deformity, signs of trauma, lacerations, spasms or bony tenderness. Normal range of motion. No scoliosis.  Skin:    General: Skin is warm and dry.  Neurological:     Mental Status: He is alert and oriented to person, place, and time. Mental status is at baseline.  Psychiatric:        Mood and Affect: Mood normal.        Behavior: Behavior normal.        Thought Content: Thought content normal.        Judgment: Judgment normal.

## 2019-08-09 NOTE — Patient Instructions (Addendum)
Ibuprofen 400-600 mg WITH Tylenol 500 mg every 6-8 hours as needed for pain. Heating pad Muscle rubs - Biofreeze, [place] Gel, Horse Liniment Muscle relaxer Lidoderm patch (Salonpas)   Low Back Sprain or Strain Rehab Ask your health care provider which exercises are safe for you. Do exercises exactly as told by your health care provider and adjust them as directed. It is normal to feel mild stretching, pulling, tightness, or discomfort as you do these exercises. Stop right away if you feel sudden pain or your pain gets worse. Do not begin these exercises until told by your health care provider. Stretching and range-of-motion exercises These exercises warm up your muscles and joints and improve the movement and flexibility of your back. These exercises also help to relieve pain, numbness, and tingling. Lumbar rotation  1. Lie on your back on a firm surface and bend your knees. 2. Straighten your arms out to your sides so each arm forms a 90-degree angle (right angle) with a side of your body. 3. Slowly move (rotate) both of your knees to one side of your body until you feel a stretch in your lower back (lumbar). Try not to let your shoulders lift off the floor. 4. Hold this position for __________ seconds. 5. Tense your abdominal muscles and slowly move your knees back to the starting position. 6. Repeat this exercise on the other side of your body. Repeat __________ times. Complete this exercise __________ times a day. Single knee to chest  1. Lie on your back on a firm surface with both legs straight. 2. Bend one of your knees. Use your hands to move your knee up toward your chest until you feel a gentle stretch in your lower back and buttock. ? Hold your leg in this position by holding on to the front of your knee. ? Keep your other leg as straight as possible. 3. Hold this position for __________ seconds. 4. Slowly return to the starting position. 5. Repeat with your other leg. Repeat  __________ times. Complete this exercise __________ times a day. Prone extension on elbows  1. Lie on your abdomen on a firm surface (prone position). 2. Prop yourself up on your elbows. 3. Use your arms to help lift your chest up until you feel a gentle stretch in your abdomen and your lower back. ? This will place some of your body weight on your elbows. If this is uncomfortable, try stacking pillows under your chest. ? Your hips should stay down, against the surface that you are lying on. Keep your hip and back muscles relaxed. 4. Hold this position for __________ seconds. 5. Slowly relax your upper body and return to the starting position. Repeat __________ times. Complete this exercise __________ times a day. Strengthening exercises These exercises build strength and endurance in your back. Endurance is the ability to use your muscles for a long time, even after they get tired. Pelvic tilt This exercise strengthens the muscles that lie deep in the abdomen. 1. Lie on your back on a firm surface. Bend your knees and keep your feet flat on the floor. 2. Tense your abdominal muscles. Tip your pelvis up toward the ceiling and flatten your lower back into the floor. ? To help with this exercise, you may place a small towel under your lower back and try to push your back into the towel. 3. Hold this position for __________ seconds. 4. Let your muscles relax completely before you repeat this exercise. Repeat __________ times. Complete  this exercise __________ times a day. Alternating arm and leg raises  1. Get on your hands and knees on a firm surface. If you are on a hard floor, you may want to use padding, such as an exercise mat, to cushion your knees. 2. Line up your arms and legs. Your hands should be directly below your shoulders, and your knees should be directly below your hips. 3. Lift your left leg behind you. At the same time, raise your right arm and straighten it in front of you.  ? Do not lift your leg higher than your hip. ? Do not lift your arm higher than your shoulder. ? Keep your abdominal and back muscles tight. ? Keep your hips facing the ground. ? Do not arch your back. ? Keep your balance carefully, and do not hold your breath. 4. Hold this position for __________ seconds. 5. Slowly return to the starting position. 6. Repeat with your right leg and your left arm. Repeat __________ times. Complete this exercise __________ times a day. Abdominal set with straight leg raise  1. Lie on your back on a firm surface. 2. Bend one of your knees and keep your other leg straight. 3. Tense your abdominal muscles and lift your straight leg up, 4-6 inches (10-15 cm) off the ground. 4. Keep your abdominal muscles tight and hold this position for __________ seconds. ? Do not hold your breath. ? Do not arch your back. Keep it flat against the ground. 5. Keep your abdominal muscles tense as you slowly lower your leg back to the starting position. 6. Repeat with your other leg. Repeat __________ times. Complete this exercise __________ times a day. Single leg lower with bent knees 1. Lie on your back on a firm surface. 2. Tense your abdominal muscles and lift your feet off the floor, one foot at a time, so your knees and hips are bent in 90-degree angles (right angles). ? Your knees should be over your hips and your lower legs should be parallel to the floor. 3. Keeping your abdominal muscles tense and your knee bent, slowly lower one of your legs so your toe touches the ground. 4. Lift your leg back up to return to the starting position. ? Do not hold your breath. ? Do not let your back arch. Keep your back flat against the ground. 5. Repeat with your other leg. Repeat __________ times. Complete this exercise __________ times a day. Posture and body mechanics Good posture and healthy body mechanics can help to relieve stress in your body's tissues and joints. Body  mechanics refers to the movements and positions of your body while you do your daily activities. Posture is part of body mechanics. Good posture means:  Your spine is in its natural S-curve position (neutral).  Your shoulders are pulled back slightly.  Your head is not tipped forward. Follow these guidelines to improve your posture and body mechanics in your everyday activities. Standing   When standing, keep your spine neutral and your feet about hip width apart. Keep a slight bend in your knees. Your ears, shoulders, and hips should line up.  When you do a task in which you stand in one place for a long time, place one foot up on a stable object that is 2-4 inches (5-10 cm) high, such as a footstool. This helps keep your spine neutral. Sitting   When sitting, keep your spine neutral and keep your feet flat on the floor. Use a footrest, if  necessary, and keep your thighs parallel to the floor. Avoid rounding your shoulders, and avoid tilting your head forward.  When working at a desk or a computer, keep your desk at a height where your hands are slightly lower than your elbows. Slide your chair under your desk so you are close enough to maintain good posture.  When working at a computer, place your monitor at a height where you are looking straight ahead and you do not have to tilt your head forward or downward to look at the screen. Resting  When lying down and resting, avoid positions that are most painful for you.  If you have pain with activities such as sitting, bending, stooping, or squatting, lie in a position in which your body does not bend very much. For example, avoid curling up on your side with your arms and knees near your chest (fetal position).  If you have pain with activities such as standing for a long time or reaching with your arms, lie with your spine in a neutral position and bend your knees slightly. Try the following positions: ? Lying on your side with a pillow  between your knees. ? Lying on your back with a pillow under your knees. Lifting   When lifting objects, keep your feet at least shoulder width apart and tighten your abdominal muscles.  Bend your knees and hips and keep your spine neutral. It is important to lift using the strength of your legs, not your back. Do not lock your knees straight out.  Always ask for help to lift heavy or awkward objects. This information is not intended to replace advice given to you by your health care provider. Make sure you discuss any questions you have with your health care provider. Document Revised: 10/27/2018 Document Reviewed: 07/27/2018 Elsevier Patient Education  [employer].

## 2019-08-12 ENCOUNTER — Encounter: Payer: Self-pay | Admitting: Family Medicine

## 2019-08-29 ENCOUNTER — Other Ambulatory Visit: Payer: Self-pay | Admitting: Family Medicine

## 2019-08-29 DIAGNOSIS — F3341 Major depressive disorder, recurrent, in partial remission: Secondary | ICD-10-CM

## 2019-08-29 DIAGNOSIS — F411 Generalized anxiety disorder: Secondary | ICD-10-CM

## 2019-09-06 ENCOUNTER — Other Ambulatory Visit: Payer: Self-pay | Admitting: Family Medicine

## 2019-09-06 DIAGNOSIS — F3341 Major depressive disorder, recurrent, in partial remission: Secondary | ICD-10-CM

## 2019-09-06 DIAGNOSIS — F411 Generalized anxiety disorder: Secondary | ICD-10-CM

## 2019-09-28 ENCOUNTER — Ambulatory Visit (INDEPENDENT_AMBULATORY_CARE_PROVIDER_SITE_OTHER): Payer: BC Managed Care – PPO | Admitting: Family Medicine

## 2019-09-28 ENCOUNTER — Encounter: Payer: Self-pay | Admitting: Family Medicine

## 2019-09-28 DIAGNOSIS — I10 Essential (primary) hypertension: Secondary | ICD-10-CM

## 2019-09-28 DIAGNOSIS — F411 Generalized anxiety disorder: Secondary | ICD-10-CM

## 2019-09-28 DIAGNOSIS — F3341 Major depressive disorder, recurrent, in partial remission: Secondary | ICD-10-CM

## 2019-09-28 DIAGNOSIS — E782 Mixed hyperlipidemia: Secondary | ICD-10-CM

## 2019-09-28 MED ORDER — ESCITALOPRAM OXALATE 10 MG PO TABS: 10.0000 mg | ORAL_TABLET | Freq: Every day | ORAL | 3 refills | Status: DC

## 2019-09-28 MED ORDER — SILDENAFIL CITRATE 20 MG PO TABS: 20.0000 mg | ORAL_TABLET | ORAL | 3 refills | Status: DC | PRN

## 2019-09-28 NOTE — Progress Notes (Signed)
Virtual Visit via telephone Note  I connected with Benjamin Zamora on 09/28/19 at [place] [facility] by telephone and verified that I am speaking with the correct person using two identifiers. Benjamin Zamora is currently located at home and no other people are currently with her during visit. The provider, Fransisca Kaufmann Tajae Rybicki, MD is located in their office at time of visit.  Call ended at 100  I discussed the limitations, risks, security and privacy concerns of performing an evaluation and management service by telephone and the availability of in person appointments. I also discussed with the patient that there may be a patient responsible charge related to this service. The patient expressed understanding and agreed to proceed.   History and Present Illness: Hyperlipidemia Patient is coming in for recheck of his hyperlipidemia. The patient is currently taking atorvastatin. They deny any issues with myalgias or history of liver damage from it. They deny any focal numbness or weakness or chest pain.   Anxiety Patient uses clonazepam at night for sleep and uses 1/4 of them and makes them last longer and takes Lexapro to help with that as well.  He only takes clonazepam at night and feels like the Lexapro is helping during the day and is very satisfied with it denies any suicidal ideations or thoughts of hurting self.  ED Takes sildenafil as needed and it works  No diagnosis found.  Outpatient Encounter Medications as of 09/28/2019  Medication Sig  . atorvastatin (LIPITOR) 20 MG tablet Take 1 tablet (20 mg total) by mouth daily.  . clonazePAM (KLONOPIN) 1 MG tablet Take 1 tablet (1 mg total) by mouth 2 (two) times daily as needed. for anxiety  . escitalopram (LEXAPRO) 10 MG tablet Take 1 tablet by mouth once daily  . meclizine (ANTIVERT) 12.5 MG tablet Take 1 tablet (12.5 mg total) by mouth 3 (three) times daily as needed for dizziness. (Patient not taking: Reported on 08/09/2019)  . methocarbamol  (ROBAXIN) 500 MG tablet Take 1 tablet (500 mg total) by mouth every 8 (eight) hours as needed for muscle spasms.  . sildenafil (REVATIO) 20 MG tablet Take 1-5 tablets (20-100 mg total) by mouth as needed.   No facility-administered encounter medications on file as of 09/28/2019.    Review of Systems  Constitutional: Negative for chills and fever.  Respiratory: Negative for shortness of breath and wheezing.   Cardiovascular: Negative for chest pain and leg swelling.  Musculoskeletal: Negative for back pain and gait problem.  Skin: Negative for rash.  Neurological: Negative for dizziness, weakness and numbness.  All other systems reviewed and are negative.   Observations/Objective: Patient sounds comfortable and in no acute distress  Assessment and Plan: Problem List Items Addressed This Visit      Cardiovascular and Mediastinum   Essential hypertension   Relevant Medications   sildenafil (REVATIO) 20 MG tablet     Other   GAD (generalized anxiety disorder)   Relevant Medications   escitalopram (LEXAPRO) 10 MG tablet   Depression   Relevant Medications   escitalopram (LEXAPRO) 10 MG tablet   Mixed hyperlipidemia - Primary   Relevant Medications   sildenafil (REVATIO) 20 MG tablet      Continue current medication, no changes, will have come back soon because he needs it visit for physical and blood work. Follow up plan: Return in about 6 weeks (around 11/09/2019), or if symptoms worsen or fail to improve, for Follow-up for chronic issues and blood work and recheck of anxiety.Marland Kitchen  I discussed the assessment and treatment plan with the patient. The patient was provided an opportunity to ask questions and all were answered. The patient agreed with the plan and demonstrated an understanding of the instructions.   The patient was advised to call back or seek an in-person evaluation if the symptoms worsen or if the condition fails to improve as anticipated.  The above  assessment and management plan was discussed with the patient. The patient verbalized understanding of and has agreed to the management plan. Patient is aware to call the clinic if symptoms persist or worsen. Patient is aware when to return to the clinic for a follow-up visit. Patient educated on when it is appropriate to go to the emergency department.    I provided 15 minutes of non-face-to-face time during this encounter.    Worthy Rancher, MD

## 2019-11-15 ENCOUNTER — Other Ambulatory Visit: Payer: Self-pay

## 2019-11-15 ENCOUNTER — Ambulatory Visit (INDEPENDENT_AMBULATORY_CARE_PROVIDER_SITE_OTHER): Payer: BC Managed Care – PPO | Admitting: Family Medicine

## 2019-11-15 ENCOUNTER — Encounter: Payer: Self-pay | Admitting: Family Medicine

## 2019-11-15 VITALS — BP 125/86 | HR 98 | Temp 97.7°F | Ht 72.0 in | Wt 216.0 lb

## 2019-11-15 DIAGNOSIS — F411 Generalized anxiety disorder: Secondary | ICD-10-CM

## 2019-11-15 DIAGNOSIS — F3341 Major depressive disorder, recurrent, in partial remission: Secondary | ICD-10-CM

## 2019-11-15 DIAGNOSIS — E782 Mixed hyperlipidemia: Secondary | ICD-10-CM

## 2019-11-15 DIAGNOSIS — I10 Essential (primary) hypertension: Secondary | ICD-10-CM | POA: Diagnosis not present

## 2019-11-15 DIAGNOSIS — Z79891 Long term (current) use of opiate analgesic: Secondary | ICD-10-CM | POA: Diagnosis not present

## 2019-11-15 MED ORDER — ESCITALOPRAM OXALATE 10 MG PO TABS: 10.0000 mg | ORAL_TABLET | Freq: Every day | ORAL | 3 refills | Status: DC

## 2019-11-15 MED ORDER — CLONAZEPAM 1 MG PO TABS: 1.0000 mg | ORAL_TABLET | Freq: Every day | ORAL | 5 refills | Status: DC | PRN

## 2019-11-15 MED ORDER — ATORVASTATIN CALCIUM 20 MG PO TABS: 20.0000 mg | ORAL_TABLET | Freq: Every day | ORAL | 3 refills | Status: DC

## 2019-11-15 NOTE — Progress Notes (Signed)
BP 125/86   Pulse 98   Temp 97.7 F (36.5 C)   Ht 6' (1.829 m)   Wt 216 lb (98 kg)   SpO2 93%   BMI 29.29 kg/m    Subjective:   Patient ID: Benjamin Zamora, male    DOB: Jan 11, 1966, 54 y.o.   MRN: [id number]  HPI: Benjamin Zamora is a 54 y.o. male presenting on 11/15/2019 for Medical Management of Chronic Issues and Anxiety   HPI Hyperlipidemia Patient is coming in for recheck of his hyperlipidemia. The patient is currently taking Lipitor. They deny any issues with myalgias or history of liver damage from it. They deny any focal numbness or weakness or chest pain.   Hypertension Patient is currently on no blood pressure medication currently has been diet-controlled we are monitoring, and their blood pressure today is 125/86. Patient denies any lightheadedness or dizziness. Patient denies headaches, blurred vision, chest pains, shortness of breath, or weakness. Denies any side effects from medication and is content with current medication.   Depression anxiety Patient is coming in for depression and anxiety, he currently takes Lexapro to help with anxiety during the day and then he just uses the clonazepam at night, he commonly cuts it in half and spaces it out.  He denies any suicidal ideations or thoughts to hurt himself. Current rx-clonazepam 1 mg daily as needed # meds rx-30 Effectiveness of current meds-works well Adverse reactions form meds-none  Pill count performed-No Last drug screen -N/A, ( high risk q60m moderate risk q630mlow risk yearly ) Urine drug screen today- Yes [place]  If yes were their any concerning findings? -None  No flowsheet data found.   Controlled substance contract signed on: Today  Relevant past medical, surgical, family and social history reviewed and updated as indicated. Interim medical history since our last visit reviewed. Allergies and medications reviewed and updated.  Review of Systems  Constitutional:  Positive for fatigue (Patient complains of some fatigue although he says he works long hours and does not sleep because of those long hours as much.). Negative for chills and fever.  Eyes: Negative for discharge.  Respiratory: Negative for shortness of breath and wheezing.   Cardiovascular: Negative for chest pain and leg swelling.  Musculoskeletal: Negative for back pain and gait problem.  Skin: Negative for rash.  Neurological: Negative for dizziness.  All other systems reviewed and are negative.   Per HPI unless specifically indicated above   Allergies as of 11/15/2019   No Known Allergies     Medication List       Accurate as of November 15, 2019  2:34 PM. If you have any questions, ask your nurse or doctor.        atorvastatin 20 MG tablet Commonly known as: LIPITOR Take 1 tablet (20 mg total) by mouth daily.   clonazePAM 1 MG tablet Commonly known as: KLONOPIN Take 1 tablet (1 mg total) by mouth daily as needed. for anxiety What changed: when to take this Changed by: JoWorthy RancherMD   escitalopram 10 MG tablet Commonly known as: LEXAPRO Take 1 tablet (10 mg total) by mouth daily.   sildenafil 20 MG tablet Commonly known as: REVATIO Take 1-5 tablets (20-100 mg total) by mouth as needed.        Objective:   BP 125/86   Pulse 98   Temp 97.7 F (36.5 C)   Ht 6' (1.829 m)   Wt 216 lb (98 kg)  SpO2 93%   BMI 29.29 kg/m   Wt Readings from Last 3 Encounters:  11/15/19 216 lb (98 kg)  08/09/19 213 lb (96.6 kg)  09/15/18 203 lb 9.6 oz (92.4 kg)    Physical Exam Vitals and nursing note reviewed.  Constitutional:      General: He is not in acute distress.    Appearance: He is well-developed. He is not diaphoretic.  Eyes:     General: No scleral icterus.    Conjunctiva/sclera: Conjunctivae normal.  Neck:     Thyroid: No thyromegaly.  Cardiovascular:     Rate and Rhythm: Normal rate and regular rhythm.     Heart sounds: Normal heart sounds. No  murmur.  Pulmonary:     Effort: Pulmonary effort is normal. No respiratory distress.     Breath sounds: Normal breath sounds. No wheezing.  Musculoskeletal:        General: Normal range of motion.     Cervical back: Neck supple.  Lymphadenopathy:     Cervical: No cervical adenopathy.  Skin:    General: Skin is warm and dry.     Findings: No rash.  Neurological:     Mental Status: He is alert and oriented to person, place, and time.     Coordination: Coordination normal.  Psychiatric:        Behavior: Behavior normal.       Assessment & Plan:   Problem List Items Addressed This Visit      Cardiovascular and Mediastinum   Essential hypertension   Relevant Medications   atorvastatin (LIPITOR) 20 MG tablet   Other Relevant Orders   CBC with Differential/Platelet   CMP14+EGFR     Other   GAD (generalized anxiety disorder)   Relevant Medications   clonazePAM (KLONOPIN) 1 MG tablet   escitalopram (LEXAPRO) 10 MG tablet   Other Relevant Orders   CBC with Differential/Platelet   Depression - Primary   Relevant Medications   clonazePAM (KLONOPIN) 1 MG tablet   escitalopram (LEXAPRO) 10 MG tablet   Other Relevant Orders   ToxASSURE Select 13 (MW), Urine   CBC with Differential/Platelet   Mixed hyperlipidemia   Relevant Medications   atorvastatin (LIPITOR) 20 MG tablet   Other Relevant Orders   Lipid panel      Continue current medication, no changes, will check blood work and do a urine drug screen. Follow up plan: Return in about 6 months (around 05/16/2020), or if symptoms worsen or fail to improve, for Anxiety and cholesterol.  Counseling provided for all of the vaccine components Orders Placed This Encounter  Procedures  . ToxASSURE Select 13 (MW), Urine  . CBC with Differential/Platelet  . CMP14+EGFR  . Lipid panel    Benjamin Pina, MD [facility] 11/15/2019, 2:34 PM

## 2019-11-16 LAB — CMP14+EGFR
ALT: 28 IU/L (ref 0–44)
AST: 20 IU/L (ref 0–40)
Albumin/Globulin Ratio: 1.6 (ref 1.2–2.2)
Albumin: 4.2 g/dL (ref 3.8–4.9)
Alkaline Phosphatase: 77 IU/L (ref 39–117)
BUN/Creatinine Ratio: 14 (ref 9–20)
BUN: 13 mg/dL (ref 6–24)
Bilirubin Total: <0.2 mg/dL (ref 0.0–1.2)
CO2: 17 mmol/L — ABNORMAL LOW (ref 20–29)
Calcium: 9 mg/dL (ref 8.7–10.2)
Chloride: 104 mmol/L (ref 96–106)
Creatinine, Ser: 0.93 mg/dL (ref 0.76–1.27)
GFR calc Af Amer: 107 mL/min/{1.73_m2} (ref >59)
GFR calc non Af Amer: 93 mL/min/{1.73_m2} (ref >59)
Globulin, Total: 2.6 g/dL (ref 1.5–4.5)
Glucose: 154 mg/dL — ABNORMAL HIGH (ref 65–99)
Potassium: 3.8 mmol/L (ref 3.5–5.2)
Sodium: 139 mmol/L (ref 134–144)
Total Protein: 6.8 g/dL (ref 6.0–8.5)

## 2019-11-16 LAB — CBC WITH DIFFERENTIAL/PLATELET
Basophils Absolute: 0.1 10*3/uL (ref 0.0–0.2)
Basos: 1 %
EOS (ABSOLUTE): 0.3 10*3/uL (ref 0.0–0.4)
Eos: 3 %
Hematocrit: 45 % (ref 37.5–51.0)
Hemoglobin: 15.6 g/dL (ref 13.0–17.7)
Immature Grans (Abs): 0 10*3/uL (ref 0.0–0.1)
Immature Granulocytes: 0 %
Lymphocytes Absolute: 2.4 10*3/uL (ref 0.7–3.1)
Lymphs: 26 %
MCH: 31.8 pg (ref 26.6–33.0)
MCHC: 34.7 g/dL (ref 31.5–35.7)
MCV: 92 fL (ref 79–97)
Monocytes Absolute: 0.6 10*3/uL (ref 0.1–0.9)
Monocytes: 6 %
Neutrophils Absolute: 5.8 10*3/uL (ref 1.4–7.0)
Neutrophils: 64 %
Platelets: 214 10*3/uL (ref 150–450)
RBC: 4.91 x10E6/uL (ref 4.14–5.80)
RDW: 12.6 % (ref 11.6–15.4)
WBC: 9.2 10*3/uL (ref 3.4–10.8)

## 2019-11-16 LAB — LIPID PANEL
Chol/HDL Ratio: 8 ratio — ABNORMAL HIGH (ref 0.0–5.0)
Cholesterol, Total: 248 mg/dL — ABNORMAL HIGH (ref 100–199)
HDL: 31 mg/dL — ABNORMAL LOW (ref >39)
Triglycerides: 819 mg/dL (ref 0–149)

## 2019-11-18 LAB — TOXASSURE SELECT 13 (MW), URINE

## 2020-05-15 ENCOUNTER — Other Ambulatory Visit: Payer: Self-pay | Admitting: Family Medicine

## 2020-05-15 DIAGNOSIS — F411 Generalized anxiety disorder: Secondary | ICD-10-CM

## 2020-05-15 DIAGNOSIS — F3341 Major depressive disorder, recurrent, in partial remission: Secondary | ICD-10-CM

## 2020-05-16 ENCOUNTER — Other Ambulatory Visit: Payer: Self-pay

## 2020-05-16 ENCOUNTER — Ambulatory Visit: Payer: BC Managed Care – PPO | Admitting: Family Medicine

## 2020-05-16 ENCOUNTER — Encounter: Payer: Self-pay | Admitting: Family Medicine

## 2020-05-16 VITALS — BP 120/80 | HR 87 | Temp 97.0°F | Ht 72.0 in | Wt 212.0 lb

## 2020-05-16 DIAGNOSIS — F411 Generalized anxiety disorder: Secondary | ICD-10-CM

## 2020-05-16 DIAGNOSIS — I10 Essential (primary) hypertension: Secondary | ICD-10-CM | POA: Diagnosis not present

## 2020-05-16 DIAGNOSIS — R739 Hyperglycemia, unspecified: Secondary | ICD-10-CM | POA: Diagnosis not present

## 2020-05-16 DIAGNOSIS — E782 Mixed hyperlipidemia: Secondary | ICD-10-CM | POA: Diagnosis not present

## 2020-05-16 DIAGNOSIS — F3341 Major depressive disorder, recurrent, in partial remission: Secondary | ICD-10-CM | POA: Diagnosis not present

## 2020-05-16 DIAGNOSIS — Z1159 Encounter for screening for other viral diseases: Secondary | ICD-10-CM

## 2020-05-16 LAB — LIPID PANEL

## 2020-05-16 LAB — BAYER DCA HB A1C WAIVED: HB A1C (BAYER DCA - WAIVED): 5.4 % (ref <7.0)

## 2020-05-16 MED ORDER — CLONAZEPAM 1 MG PO TABS: 1.0000 mg | ORAL_TABLET | Freq: Every day | ORAL | 5 refills | Status: DC | PRN

## 2020-05-16 NOTE — Progress Notes (Signed)
BP 120/80   Pulse 87   Temp (!) 97 F (36.1 C)   Ht 6' (1.829 m)   Wt 212 lb (96.2 kg)   SpO2 95%   BMI 28.75 kg/m    Subjective:   Patient ID: Benjamin Zamora, male    DOB: 06-Jul-1966, 54 y.o.   MRN: [id number]  HPI: Benjamin Zamora is a 53 y.o. male presenting on 05/16/2020 for Medical Management of Chronic Issues, Hyperlipidemia, and Depression   HPI Hyperlipidemia hypertriglyceridemia Patient is coming in for recheck of his hyperlipidemia. The patient is currently taking no medication for it, did not tolerate his Lipitor. They deny any issues with myalgias or history of liver damage from it. They deny any focal numbness or weakness or chest pain.   Depression and anxiety Current rx- lexapro and klonipin daily as needed # meds rx- 30 Effectiveness of current meds-well, very happy about his medicines Adverse reactions form meds-none currently   Pill count performed-No Last drug screen -12/04/2019 ( high risk q64m moderate risk q627mlow risk yearly ) Urine drug screen today- No [place]  If yes were their any concerning findings? -None  No flowsheet data found.   Controlled substance contract signed on: 12/04/2019  Relevant past medical, surgical, family and social history reviewed and updated as indicated. Interim medical history since our last visit reviewed. Allergies and medications reviewed and updated.  Review of Systems  Constitutional: Negative for chills and fever.  Eyes: Negative for visual disturbance.  Respiratory: Negative for shortness of breath and wheezing.   Cardiovascular: Negative for chest pain and leg swelling.  Musculoskeletal: Negative for back pain and gait problem.  Skin: Negative for rash.  Neurological: Negative for dizziness.  All other systems reviewed and are negative.   Per HPI unless specifically indicated above   Allergies as of 05/16/2020   No Known Allergies     Medication List       Accurate  as of May 16, 2020  3:33 PM. If you have any questions, ask your nurse or doctor.        STOP taking these medications   atorvastatin 20 MG tablet Commonly known as: LIPITOR Stopped by: JoFransisca Kaufmannettinger, MD     TAKE these medications   clonazePAM 1 MG tablet Commonly known as: KLONOPIN Take 1 tablet (1 mg total) by mouth daily as needed. for anxiety   escitalopram 10 MG tablet Commonly known as: LEXAPRO Take 1 tablet (10 mg total) by mouth daily.   sildenafil 20 MG tablet Commonly known as: REVATIO Take 1-5 tablets (20-100 mg total) by mouth as needed.        Objective:   BP 120/80   Pulse 87   Temp (!) 97 F (36.1 C)   Ht 6' (1.829 m)   Wt 212 lb (96.2 kg)   SpO2 95%   BMI 28.75 kg/m   Wt Readings from Last 3 Encounters:  05/16/20 212 lb (96.2 kg)  11/15/19 216 lb (98 kg)  08/09/19 213 lb (96.6 kg)    Physical Exam Vitals and nursing note reviewed.  Constitutional:      General: He is not in acute distress.    Appearance: He is well-developed. He is not diaphoretic.  Eyes:     General: No scleral icterus.    Conjunctiva/sclera: Conjunctivae normal.  Neck:     Thyroid: No thyromegaly.  Cardiovascular:     Rate and Rhythm: Normal rate and regular rhythm.  Heart sounds: Normal heart sounds. No murmur heard.   Pulmonary:     Effort: Pulmonary effort is normal. No respiratory distress.     Breath sounds: Normal breath sounds. No wheezing.  Musculoskeletal:        General: Normal range of motion.     Cervical back: Neck supple.  Lymphadenopathy:     Cervical: No cervical adenopathy.  Skin:    General: Skin is warm and dry.     Findings: No rash.  Neurological:     Mental Status: He is alert and oriented to person, place, and time.     Coordination: Coordination normal.  Psychiatric:        Behavior: Behavior normal.       Assessment & Plan:   Problem List Items Addressed This Visit      Cardiovascular and Mediastinum   Essential  hypertension   Relevant Orders   CBC with Differential/Platelet   CMP14+EGFR     Other   GAD (generalized anxiety disorder)   Relevant Medications   clonazePAM (KLONOPIN) 1 MG tablet   Depression   Relevant Medications   clonazePAM (KLONOPIN) 1 MG tablet   Mixed hyperlipidemia - Primary   Relevant Orders   Lipid panel    Other Visit Diagnoses    Encounter for hepatitis C screening test for low risk patient       Relevant Orders   Hepatitis C antibody   Elevated blood sugar       Relevant Orders   Bayer DCA Hb A1c Waived      Continue current medication, no changes, recommended refills. Follow up plan: Return in about 6 months (around 11/14/2020), or if symptoms worsen or fail to improve, for htn and anxiety.  Counseling provided for all of the vaccine components No orders of the defined types were placed in this encounter.   Caryl Pina, MD [facility] 05/16/2020, 3:33 PM

## 2020-05-16 NOTE — Telephone Encounter (Signed)
Patient requesting refill on clonazepam. Patient has appointment today.

## 2020-05-17 LAB — CMP14+EGFR
ALT: 26 IU/L (ref 0–44)
AST: 17 IU/L (ref 0–40)
Albumin/Globulin Ratio: 1.9 (ref 1.2–2.2)
Albumin: 4.3 g/dL (ref 3.8–4.9)
Alkaline Phosphatase: 73 IU/L (ref 44–121)
BUN/Creatinine Ratio: 13 (ref 9–20)
BUN: 13 mg/dL (ref 6–24)
Bilirubin Total: <0.2 mg/dL (ref 0.0–1.2)
CO2: 22 mmol/L (ref 20–29)
Calcium: 9.3 mg/dL (ref 8.7–10.2)
Chloride: 104 mmol/L (ref 96–106)
Creatinine, Ser: 0.99 mg/dL (ref 0.76–1.27)
GFR calc Af Amer: 99 mL/min/{1.73_m2} (ref >59)
GFR calc non Af Amer: 86 mL/min/{1.73_m2} (ref >59)
Globulin, Total: 2.3 g/dL (ref 1.5–4.5)
Glucose: 91 mg/dL (ref 65–99)
Potassium: 4.2 mmol/L (ref 3.5–5.2)
Sodium: 137 mmol/L (ref 134–144)
Total Protein: 6.6 g/dL (ref 6.0–8.5)

## 2020-05-17 LAB — LIPID PANEL
Chol/HDL Ratio: 8 ratio — ABNORMAL HIGH (ref 0.0–5.0)
Cholesterol, Total: 231 mg/dL — ABNORMAL HIGH (ref 100–199)
HDL: 29 mg/dL — ABNORMAL LOW (ref >39)
LDL Chol Calc (NIH): 99 mg/dL (ref 0–99)
Triglycerides: 606 mg/dL (ref 0–149)
VLDL Cholesterol Cal: 103 mg/dL — ABNORMAL HIGH (ref 5–40)

## 2020-05-17 LAB — CBC WITH DIFFERENTIAL/PLATELET
Basophils Absolute: 0.1 10*3/uL (ref 0.0–0.2)
Basos: 1 %
EOS (ABSOLUTE): 0.3 10*3/uL (ref 0.0–0.4)
Eos: 3 %
Hematocrit: 45.3 % (ref 37.5–51.0)
Hemoglobin: 15.6 g/dL (ref 13.0–17.7)
Immature Grans (Abs): 0 10*3/uL (ref 0.0–0.1)
Immature Granulocytes: 1 %
Lymphocytes Absolute: 2.5 10*3/uL (ref 0.7–3.1)
Lymphs: 30 %
MCH: 31.2 pg (ref 26.6–33.0)
MCHC: 34.4 g/dL (ref 31.5–35.7)
MCV: 91 fL (ref 79–97)
Monocytes Absolute: 0.8 10*3/uL (ref 0.1–0.9)
Monocytes: 9 %
Neutrophils Absolute: 4.8 10*3/uL (ref 1.4–7.0)
Neutrophils: 56 %
Platelets: 239 10*3/uL (ref 150–450)
RBC: 5 x10E6/uL (ref 4.14–5.80)
RDW: 13 % (ref 11.6–15.4)
WBC: 8.5 10*3/uL (ref 3.4–10.8)

## 2020-05-17 LAB — HEPATITIS C ANTIBODY: Hep C Virus Ab: 0.1 s/co ratio (ref 0.0–0.9)

## 2020-06-18 ENCOUNTER — Other Ambulatory Visit: Payer: Self-pay

## 2020-06-18 ENCOUNTER — Ambulatory Visit: Payer: BC Managed Care – PPO | Admitting: Family Medicine

## 2020-06-18 VITALS — BP 134/89 | HR 93 | Temp 97.8°F

## 2020-06-18 DIAGNOSIS — M62830 Muscle spasm of back: Secondary | ICD-10-CM | POA: Diagnosis not present

## 2020-06-18 MED ORDER — DICLOFENAC SODIUM 75 MG PO TBEC: 75.0000 mg | DELAYED_RELEASE_TABLET | Freq: Two times a day (BID) | ORAL | 0 refills | Status: AC | PRN

## 2020-06-18 MED ORDER — BACLOFEN 10 MG PO TABS: 10.0000 mg | ORAL_TABLET | Freq: Three times a day (TID) | ORAL | 0 refills | Status: DC | PRN

## 2020-06-18 MED ORDER — KETOROLAC TROMETHAMINE 30 MG/ML IJ SOLN
30.0000 mg | Freq: Once | INTRAMUSCULAR | Status: AC
  Administered 2020-06-18: 30 mg via INTRAMUSCULAR

## 2020-06-18 NOTE — Patient Instructions (Signed)
You were given a shot of toradol today. Start the Diclofenac with supper tonight.  ALWAYS eat with this medication. Ok to take the Baclofen now but it may cause sleepiness.  Heat to the back, salonpas patches may be helpful. Start the home stretches tomorrow.

## 2020-06-18 NOTE — Progress Notes (Signed)
Subjective: CC: Low back pain PCP: Dettinger, Fransisca Kaufmann, MD PPJ:KDTOIZTI Benjamin Zamora is a 54 y.o. male presenting to clinic today for:  1.  Low back pain Patient reports acute onset over the weekend.  On Friday he had been cleaning up some wood and lifting, pushing and pulling repetitively.  He notes that the back started to hurt the low back over the weekend but really came to ahead today.  He has been using ice on his back and trying to medicate with OTC analgesics but these are not helpful.  Denies any radiation down the legs.  No sensory changes.  No fecal incontinence, urinary retention or saddle anesthesia.   ROS: Per HPI  No Known Allergies Past Medical History:  Diagnosis Date  . Anxiety   . Depression   . Drug abuse ([place]) 1995   Stopped in 1995  . Hyperlipidemia     Current Outpatient Medications:  .  clonazePAM (KLONOPIN) 1 MG tablet, Take 1 tablet (1 mg total) by mouth daily as needed. for anxiety, Disp: 30 tablet, Rfl: 5 .  escitalopram (LEXAPRO) 10 MG tablet, Take 1 tablet (10 mg total) by mouth daily., Disp: 90 tablet, Rfl: 3 .  sildenafil (REVATIO) 20 MG tablet, Take 1-5 tablets (20-100 mg total) by mouth as needed., Disp: 30 tablet, Rfl: 3 Social History   Socioeconomic History  . Marital status: Married    Spouse name: Not on file  . Number of children: Not on file  . Years of education: Not on file  . Highest education level: Not on file  Occupational History  . Not on file  Tobacco Use  . Smoking status: Current Every Day Smoker    Packs/day: 1.50    Types: Cigarettes  . Smokeless tobacco: Never Used  Vaping Use  . Vaping Use: Never used  Substance and Sexual Activity  . Alcohol use: Yes    Alcohol/week: 6.0 - 8.0 standard drinks    Types: 6 - 8 Cans of beer per week  . Drug use: No  . Sexual activity: Yes  Other Topics Concern  . Not on file  Social History Narrative  . Not on file   Social Determinants of Health   Financial Resource Strain:    . Difficulty of Paying Living Expenses: Not on file  Food Insecurity:   . Worried About Charity fundraiser in the Last Year: Not on file  . Ran Out of Food in the Last Year: Not on file  Transportation Needs:   . Lack of Transportation (Medical): Not on file  . Lack of Transportation (Non-Medical): Not on file  Physical Activity:   . Days of Exercise per Week: Not on file  . Minutes of Exercise per Session: Not on file  Stress:   . Feeling of Stress : Not on file  Social Connections:   . Frequency of Communication with Friends and Family: Not on file  . Frequency of Social Gatherings with Friends and Family: Not on file  . Attends Religious Services: Not on file  . Active Member of Clubs or Organizations: Not on file  . Attends Archivist Meetings: Not on file  . Marital Status: Not on file  Intimate Partner Violence:   . Fear of Current or Ex-Partner: Not on file  . Emotionally Abused: Not on file  . Physically Abused: Not on file  . Sexually Abused: Not on file   Family History  Problem Relation Age of Onset  . Cancer  Mother   . Thyroid disease Mother   . Hypertension Father   . Thyroid disease Brother   . Colon cancer Neg Hx     Objective: Office vital signs reviewed. BP 134/89   Pulse 93   Temp 97.8 F (36.6 C)   Physical Examination:  General: Awake, alert, appears very uncomfortable Extremities: warm, well perfused, No edema, cyanosis or clubbing; +2 pulses bilaterally MSK: Antalgic gait and station  Lumbar spine: Limited active range of motion secondary to pain.  He sits on his right side to offset some of the discomfort.  He has no midline tenderness to palpation but has gross paraspinal muscle tenderness palpation along the sacroiliac and lumbosacral joints.  There is appreciable increased muscle tonicity in these areas Neuro: Strength and sensation grossly intact of the lower extremities  Assessment/ Plan: 54 y.o. male   Lumbar paraspinal  muscle spasm - Plan: ketorolac (TORADOL) 30 MG/ML injection 30 mg, diclofenac (VOLTAREN) 75 MG EC tablet, baclofen (LIORESAL) 10 MG tablet  Clinically appears to be paraspinal muscle spasm.  He was given a dose of Toradol 30 intramuscularly.  We will start oral Voltaren this evening with supper if needed for ongoing pain and inflammation.  Caution risk of GI bleed with Lexapro 10 mg daily.  Baclofen also sent.  Take cautiously as it can cause sedation.  Home physical therapy exercises provided.  He will follow-up as needed on this issue  No orders of the defined types were placed in this encounter.  No orders of the defined types were placed in this encounter.    Janora Norlander, DO [place] [phone]

## 2020-06-19 DIAGNOSIS — M9903 Segmental and somatic dysfunction of lumbar region: Secondary | ICD-10-CM | POA: Diagnosis not present

## 2020-06-19 DIAGNOSIS — M9902 Segmental and somatic dysfunction of thoracic region: Secondary | ICD-10-CM | POA: Diagnosis not present

## 2020-06-19 DIAGNOSIS — M9904 Segmental and somatic dysfunction of sacral region: Secondary | ICD-10-CM | POA: Diagnosis not present

## 2020-06-19 DIAGNOSIS — M62830 Muscle spasm of back: Secondary | ICD-10-CM | POA: Diagnosis not present

## 2020-06-20 ENCOUNTER — Ambulatory Visit: Payer: BC Managed Care – PPO | Admitting: Nurse Practitioner

## 2020-06-23 DIAGNOSIS — M9902 Segmental and somatic dysfunction of thoracic region: Secondary | ICD-10-CM | POA: Diagnosis not present

## 2020-06-23 DIAGNOSIS — M9903 Segmental and somatic dysfunction of lumbar region: Secondary | ICD-10-CM | POA: Diagnosis not present

## 2020-06-23 DIAGNOSIS — M9904 Segmental and somatic dysfunction of sacral region: Secondary | ICD-10-CM | POA: Diagnosis not present

## 2020-06-23 DIAGNOSIS — M62830 Muscle spasm of back: Secondary | ICD-10-CM | POA: Diagnosis not present

## 2020-06-25 DIAGNOSIS — M9904 Segmental and somatic dysfunction of sacral region: Secondary | ICD-10-CM | POA: Diagnosis not present

## 2020-06-25 DIAGNOSIS — M9903 Segmental and somatic dysfunction of lumbar region: Secondary | ICD-10-CM | POA: Diagnosis not present

## 2020-06-25 DIAGNOSIS — M62830 Muscle spasm of back: Secondary | ICD-10-CM | POA: Diagnosis not present

## 2020-06-25 DIAGNOSIS — M9902 Segmental and somatic dysfunction of thoracic region: Secondary | ICD-10-CM | POA: Diagnosis not present

## 2020-06-26 DIAGNOSIS — M9903 Segmental and somatic dysfunction of lumbar region: Secondary | ICD-10-CM | POA: Diagnosis not present

## 2020-06-26 DIAGNOSIS — M9902 Segmental and somatic dysfunction of thoracic region: Secondary | ICD-10-CM | POA: Diagnosis not present

## 2020-06-26 DIAGNOSIS — M62830 Muscle spasm of back: Secondary | ICD-10-CM | POA: Diagnosis not present

## 2020-06-26 DIAGNOSIS — M9904 Segmental and somatic dysfunction of sacral region: Secondary | ICD-10-CM | POA: Diagnosis not present

## 2020-06-30 DIAGNOSIS — M62830 Muscle spasm of back: Secondary | ICD-10-CM | POA: Diagnosis not present

## 2020-06-30 DIAGNOSIS — M9902 Segmental and somatic dysfunction of thoracic region: Secondary | ICD-10-CM | POA: Diagnosis not present

## 2020-06-30 DIAGNOSIS — M9903 Segmental and somatic dysfunction of lumbar region: Secondary | ICD-10-CM | POA: Diagnosis not present

## 2020-06-30 DIAGNOSIS — M9904 Segmental and somatic dysfunction of sacral region: Secondary | ICD-10-CM | POA: Diagnosis not present

## 2020-07-02 DIAGNOSIS — M9904 Segmental and somatic dysfunction of sacral region: Secondary | ICD-10-CM | POA: Diagnosis not present

## 2020-07-02 DIAGNOSIS — M9903 Segmental and somatic dysfunction of lumbar region: Secondary | ICD-10-CM | POA: Diagnosis not present

## 2020-07-02 DIAGNOSIS — M9902 Segmental and somatic dysfunction of thoracic region: Secondary | ICD-10-CM | POA: Diagnosis not present

## 2020-07-02 DIAGNOSIS — M62830 Muscle spasm of back: Secondary | ICD-10-CM | POA: Diagnosis not present

## 2020-07-03 DIAGNOSIS — M9902 Segmental and somatic dysfunction of thoracic region: Secondary | ICD-10-CM | POA: Diagnosis not present

## 2020-07-03 DIAGNOSIS — M9903 Segmental and somatic dysfunction of lumbar region: Secondary | ICD-10-CM | POA: Diagnosis not present

## 2020-07-03 DIAGNOSIS — M62830 Muscle spasm of back: Secondary | ICD-10-CM | POA: Diagnosis not present

## 2020-07-03 DIAGNOSIS — M9904 Segmental and somatic dysfunction of sacral region: Secondary | ICD-10-CM | POA: Diagnosis not present

## 2020-07-07 DIAGNOSIS — M62830 Muscle spasm of back: Secondary | ICD-10-CM | POA: Diagnosis not present

## 2020-07-07 DIAGNOSIS — M9902 Segmental and somatic dysfunction of thoracic region: Secondary | ICD-10-CM | POA: Diagnosis not present

## 2020-07-07 DIAGNOSIS — M9904 Segmental and somatic dysfunction of sacral region: Secondary | ICD-10-CM | POA: Diagnosis not present

## 2020-07-07 DIAGNOSIS — M9903 Segmental and somatic dysfunction of lumbar region: Secondary | ICD-10-CM | POA: Diagnosis not present

## 2020-07-09 DIAGNOSIS — M9904 Segmental and somatic dysfunction of sacral region: Secondary | ICD-10-CM | POA: Diagnosis not present

## 2020-07-09 DIAGNOSIS — M9902 Segmental and somatic dysfunction of thoracic region: Secondary | ICD-10-CM | POA: Diagnosis not present

## 2020-07-09 DIAGNOSIS — M62830 Muscle spasm of back: Secondary | ICD-10-CM | POA: Diagnosis not present

## 2020-07-09 DIAGNOSIS — M9903 Segmental and somatic dysfunction of lumbar region: Secondary | ICD-10-CM | POA: Diagnosis not present

## 2020-07-14 DIAGNOSIS — M9904 Segmental and somatic dysfunction of sacral region: Secondary | ICD-10-CM | POA: Diagnosis not present

## 2020-07-14 DIAGNOSIS — M62830 Muscle spasm of back: Secondary | ICD-10-CM | POA: Diagnosis not present

## 2020-07-14 DIAGNOSIS — M9902 Segmental and somatic dysfunction of thoracic region: Secondary | ICD-10-CM | POA: Diagnosis not present

## 2020-07-14 DIAGNOSIS — M9903 Segmental and somatic dysfunction of lumbar region: Secondary | ICD-10-CM | POA: Diagnosis not present

## 2020-07-17 DIAGNOSIS — M9902 Segmental and somatic dysfunction of thoracic region: Secondary | ICD-10-CM | POA: Diagnosis not present

## 2020-07-17 DIAGNOSIS — M9903 Segmental and somatic dysfunction of lumbar region: Secondary | ICD-10-CM | POA: Diagnosis not present

## 2020-07-17 DIAGNOSIS — M62830 Muscle spasm of back: Secondary | ICD-10-CM | POA: Diagnosis not present

## 2020-07-17 DIAGNOSIS — M9904 Segmental and somatic dysfunction of sacral region: Secondary | ICD-10-CM | POA: Diagnosis not present

## 2020-07-28 DIAGNOSIS — M62830 Muscle spasm of back: Secondary | ICD-10-CM | POA: Diagnosis not present

## 2020-07-28 DIAGNOSIS — M9902 Segmental and somatic dysfunction of thoracic region: Secondary | ICD-10-CM | POA: Diagnosis not present

## 2020-07-28 DIAGNOSIS — M9903 Segmental and somatic dysfunction of lumbar region: Secondary | ICD-10-CM | POA: Diagnosis not present

## 2020-07-28 DIAGNOSIS — M9904 Segmental and somatic dysfunction of sacral region: Secondary | ICD-10-CM | POA: Diagnosis not present

## 2020-07-30 ENCOUNTER — Ambulatory Visit (INDEPENDENT_AMBULATORY_CARE_PROVIDER_SITE_OTHER): Payer: BC Managed Care – PPO | Admitting: Nurse Practitioner

## 2020-07-30 ENCOUNTER — Encounter: Payer: Self-pay | Admitting: Nurse Practitioner

## 2020-07-30 ENCOUNTER — Other Ambulatory Visit: Payer: Self-pay

## 2020-07-30 VITALS — BP 132/90 | HR 85 | Temp 97.4°F

## 2020-07-30 DIAGNOSIS — J069 Acute upper respiratory infection, unspecified: Secondary | ICD-10-CM | POA: Diagnosis not present

## 2020-07-30 DIAGNOSIS — R059 Cough, unspecified: Secondary | ICD-10-CM | POA: Insufficient documentation

## 2020-07-30 MED ORDER — BENZONATATE 100 MG PO CAPS: 100.0000 mg | ORAL_CAPSULE | Freq: Three times a day (TID) | ORAL | 0 refills | Status: DC | PRN

## 2020-07-30 MED ORDER — AZITHROMYCIN 250 MG PO TABS: ORAL_TABLET | ORAL | 0 refills | Status: DC

## 2020-07-30 NOTE — Assessment & Plan Note (Signed)
Azithromycin [Z-Pak], Tessalon Perles. Follow-up for worsening unresolved symptoms  Rx sent to pharmacy.

## 2020-07-30 NOTE — Progress Notes (Signed)
Acute Office Visit  Subjective:    Patient ID: Benjamin Zamora, male    DOB: [birth year], 55 y.o.   MRN: [id number]  No chief complaint on file.   Cough This is a recurrent problem. The current episode started 1 to 4 weeks ago. The problem has been gradually improving. The cough is non-productive. Pertinent negatives include no chest pain, chills, fever, nasal congestion, shortness of breath or wheezing. Nothing aggravates the symptoms. He has tried nothing for the symptoms.  Recent Covid-19 test is negative.   Past Medical History:  Diagnosis Date  . Anxiety   . Depression   . Drug abuse ([place]) 1995   Stopped in 1995  . Hyperlipidemia     Past Surgical History:  Procedure Laterality Date  . APPENDECTOMY    . EYE SURGERY      Family History  Problem Relation Age of Onset  . Cancer Mother   . Thyroid disease Mother   . Hypertension Father   . Thyroid disease Brother   . Colon cancer Neg Hx     Social History   Socioeconomic History  . Marital status: Married    Spouse name: Not on file  . Number of children: Not on file  . Years of education: Not on file  . Highest education level: Not on file  Occupational History  . Not on file  Tobacco Use  . Smoking status: Current Every Day Smoker    Packs/day: 1.50    Types: Cigarettes  . Smokeless tobacco: Never Used  Vaping Use  . Vaping Use: Never used  Substance and Sexual Activity  . Alcohol use: Yes    Alcohol/week: 6.0 - 8.0 standard drinks    Types: 6 - 8 Cans of beer per week  . Drug use: No  . Sexual activity: Yes  Other Topics Concern  . Not on file  Social History Narrative  . Not on file   Social Determinants of Health   Financial Resource Strain: Not on file  Food Insecurity: Not on file  Transportation Needs: Not on file  Physical Activity: Not on file  Stress: Not on file  Social Connections: Not on file  Intimate Partner Violence: Not on file    Outpatient Medications Prior to  Visit  Medication Sig Dispense Refill  . baclofen (LIORESAL) 10 MG tablet Take 1 tablet (10 mg total) by mouth 3 (three) times daily as needed for muscle spasms. 30 each 0  . clonazePAM (KLONOPIN) 1 MG tablet Take 1 tablet (1 mg total) by mouth daily as needed. for anxiety 30 tablet 5  . diclofenac (VOLTAREN) 75 MG EC tablet Take 1 tablet (75 mg total) by mouth 2 (two) times daily as needed for moderate pain. Take with food 30 tablet 0  . escitalopram (LEXAPRO) 10 MG tablet Take 1 tablet (10 mg total) by mouth daily. 90 tablet 3  . sildenafil (REVATIO) 20 MG tablet Take 1-5 tablets (20-100 mg total) by mouth as needed. 30 tablet 3   No facility-administered medications prior to visit.    No Known Allergies  Review of Systems  Constitutional: Negative for chills and fever.  Respiratory: Positive for cough. Negative for shortness of breath and wheezing.   Cardiovascular: Negative for chest pain.  Gastrointestinal: Negative.   Genitourinary: Negative.   Musculoskeletal: Negative.   Skin: Negative.        Objective:    Physical Exam Vitals reviewed.  Constitutional:      Appearance: Normal appearance.  HENT:     Head: Normocephalic.     Nose: Nose normal.  Eyes:     Conjunctiva/sclera: Conjunctivae normal.  Cardiovascular:     Rate and Rhythm: Normal rate and regular rhythm.     Pulses: Normal pulses.     Heart sounds: Normal heart sounds.  Pulmonary:     Effort: Pulmonary effort is normal. No respiratory distress.     Breath sounds: Normal breath sounds. No wheezing.  Chest:     Chest wall: No tenderness.  Musculoskeletal:        General: Normal range of motion.  Skin:    General: Skin is warm.  Neurological:     Mental Status: He is alert and oriented to person, place, and time.     BP 132/90   Pulse 85   Temp (!) 97.4 F (36.3 C)   SpO2 95%  Wt Readings from Last 3 Encounters:  05/16/20 212 lb (96.2 kg)  11/15/19 216 lb (98 kg)  08/09/19 213 lb (96.6 kg)     Health Maintenance Due  Topic Date Due  . COVID-19 Vaccine (1) Never done    There are no preventive care reminders to display for this patient.   No results found for: TSH Lab Results  Component Value Date   WBC 8.5 05/16/2020   HGB 15.6 05/16/2020   HCT 45.3 05/16/2020   MCV 91 05/16/2020   PLT 239 05/16/2020   Lab Results  Component Value Date   NA 137 05/16/2020   K 4.2 05/16/2020   CO2 22 05/16/2020   GLUCOSE 91 05/16/2020   BUN 13 05/16/2020   CREATININE 0.99 05/16/2020   BILITOT <0.2 05/16/2020   ALKPHOS 73 05/16/2020   AST 17 05/16/2020   ALT 26 05/16/2020   PROT 6.6 05/16/2020   ALBUMIN 4.3 05/16/2020   CALCIUM 9.3 05/16/2020   Lab Results  Component Value Date   CHOL 231 (H) 05/16/2020   Lab Results  Component Value Date   HDL 29 (L) 05/16/2020   Lab Results  Component Value Date   LDLCALC 99 05/16/2020   Lab Results  Component Value Date   TRIG 606 ([place]) 05/16/2020   Lab Results  Component Value Date   CHOLHDL 8.0 (H) 05/16/2020   Lab Results  Component Value Date   HGBA1C 5.4 05/16/2020       Assessment & Plan:   Problem List Items Addressed This Visit      Other   Cough - Primary    Azithromycin [Z-Pak], Tessalon Perles. Follow-up for worsening unresolved symptoms  Rx sent to pharmacy.      Relevant Medications   azithromycin (ZITHROMAX) 250 MG tablet   benzonatate (TESSALON PERLES) 100 MG capsule       Meds ordered this encounter  Medications  . azithromycin (ZITHROMAX) 250 MG tablet    Sig: 500 mg (2 tablet) day 1, 250 mg (1 tablet) day 2-5    Dispense:  6 tablet    Refill:  0    Order Specific Question:   Supervising Provider    Answer:   Janora Norlander [[id number]]  . benzonatate (TESSALON PERLES) 100 MG capsule    Sig: Take 1 capsule (100 mg total) by mouth 3 (three) times daily as needed for cough.    Dispense:  60 capsule    Refill:  0    Order Specific Question:   Supervising Provider    Answer:    Janora Norlander [[id number]]  Sebastian Dzik M Caitlyn Buchanan, NP 

## 2020-08-14 DIAGNOSIS — M9902 Segmental and somatic dysfunction of thoracic region: Secondary | ICD-10-CM | POA: Diagnosis not present

## 2020-08-14 DIAGNOSIS — M9904 Segmental and somatic dysfunction of sacral region: Secondary | ICD-10-CM | POA: Diagnosis not present

## 2020-08-14 DIAGNOSIS — M9903 Segmental and somatic dysfunction of lumbar region: Secondary | ICD-10-CM | POA: Diagnosis not present

## 2020-08-14 DIAGNOSIS — M62830 Muscle spasm of back: Secondary | ICD-10-CM | POA: Diagnosis not present

## 2020-09-11 DIAGNOSIS — M62830 Muscle spasm of back: Secondary | ICD-10-CM | POA: Diagnosis not present

## 2020-09-11 DIAGNOSIS — M9904 Segmental and somatic dysfunction of sacral region: Secondary | ICD-10-CM | POA: Diagnosis not present

## 2020-09-11 DIAGNOSIS — M9903 Segmental and somatic dysfunction of lumbar region: Secondary | ICD-10-CM | POA: Diagnosis not present

## 2020-09-11 DIAGNOSIS — M9902 Segmental and somatic dysfunction of thoracic region: Secondary | ICD-10-CM | POA: Diagnosis not present

## 2020-11-12 ENCOUNTER — Other Ambulatory Visit: Payer: Self-pay

## 2020-11-12 ENCOUNTER — Ambulatory Visit: Payer: BC Managed Care – PPO | Admitting: Family Medicine

## 2020-11-12 ENCOUNTER — Encounter: Payer: Self-pay | Admitting: Family Medicine

## 2020-11-12 VITALS — BP 136/89 | HR 86 | Ht 72.0 in | Wt 211.0 lb

## 2020-11-12 DIAGNOSIS — I10 Essential (primary) hypertension: Secondary | ICD-10-CM | POA: Diagnosis not present

## 2020-11-12 DIAGNOSIS — F3341 Major depressive disorder, recurrent, in partial remission: Secondary | ICD-10-CM | POA: Diagnosis not present

## 2020-11-12 DIAGNOSIS — M62830 Muscle spasm of back: Secondary | ICD-10-CM

## 2020-11-12 DIAGNOSIS — F411 Generalized anxiety disorder: Secondary | ICD-10-CM | POA: Diagnosis not present

## 2020-11-12 DIAGNOSIS — E782 Mixed hyperlipidemia: Secondary | ICD-10-CM

## 2020-11-12 DIAGNOSIS — Z79891 Long term (current) use of opiate analgesic: Secondary | ICD-10-CM | POA: Diagnosis not present

## 2020-11-12 MED ORDER — CLONAZEPAM 1 MG PO TABS: 1.0000 mg | ORAL_TABLET | Freq: Every day | ORAL | 5 refills | Status: DC | PRN

## 2020-11-12 MED ORDER — BACLOFEN 10 MG PO TABS: 10.0000 mg | ORAL_TABLET | Freq: Three times a day (TID) | ORAL | 2 refills | Status: AC | PRN

## 2020-11-12 NOTE — Progress Notes (Signed)
BP 136/89   Pulse 86   Ht 6' (1.829 m)   Wt 211 lb (95.7 kg)   SpO2 99%   BMI 28.62 kg/m    Subjective:   Patient ID: Benjamin Zamora, male    DOB: 10/26/1965, 55 y.o.   MRN: [id number]  HPI: Adarsh Mundorf is a 55 y.o. male presenting on 11/12/2020 for Medical Management of Chronic Issues, Hyperlipidemia, and Hypertension   HPI Hypertension Patient is currently on no medication currently has been doing diet control we are monitoring., and their blood pressure today is 136/89. Patient denies any lightheadedness or dizziness. Patient denies headaches, blurred vision, chest pains, shortness of breath, or weakness. Denies any side effects from medication and is content with current medication.   Hyperlipidemia Patient is coming in for recheck of his hyperlipidemia. The patient is currently taking no medication currently, he did diet for the past 6 months and really trying to focus on that. They deny any issues with myalgias or history of liver damage from it. They deny any focal numbness or weakness or chest pain.   Anxiety Current rx-clonazepam 1 mg daily as needed # meds rx-30 Effectiveness of current meds-works well, does not have to use every day Adverse reactions form meds-none  Pill count performed-No Last drug screen -12/04/2019 ( high risk q48m moderate risk q657mlow risk yearly ) Urine drug screen today- Yes [place]  If yes were their any concerning findings? -None, does not use or fill every month  No flowsheet data found.   Controlled substance contract signed on: Today  Relevant past medical, surgical, family and social history reviewed and updated as indicated. Interim medical history since our last visit reviewed. Allergies and medications reviewed and updated.  Review of Systems  Constitutional: Negative for chills and fever.  Respiratory: Negative for shortness of breath and wheezing.   Cardiovascular: Negative for chest pain and  leg swelling.  Musculoskeletal: Positive for back pain (He gets the occasional backaches and takes baclofen for it once he gets more of that.). Negative for gait problem.  Skin: Negative for rash.  Neurological: Negative for weakness and light-headedness.  Psychiatric/Behavioral: Positive for dysphoric mood. Negative for self-injury, sleep disturbance and suicidal ideas. The patient is nervous/anxious.   All other systems reviewed and are negative.   Per HPI unless specifically indicated above   Allergies as of 11/12/2020   No Known Allergies     Medication List       Accurate as of November 12, 2020  3:31 PM. If you have any questions, ask your nurse or doctor.        STOP taking these medications   azithromycin 250 MG tablet Commonly known as: ZITHROMAX Stopped by: JoFransisca Kaufmannettinger, MD   benzonatate 100 MG capsule Commonly known as: TeBest boytopped by: JoWorthy RancherMD     TAKE these medications   baclofen 10 MG tablet Commonly known as: LIORESAL Take 1 tablet (10 mg total) by mouth 3 (three) times daily as needed for muscle spasms.   clonazePAM 1 MG tablet Commonly known as: KLONOPIN Take 1 tablet (1 mg total) by mouth daily as needed. for anxiety   diclofenac 75 MG EC tablet Commonly known as: VOLTAREN Take 1 tablet (75 mg total) by mouth 2 (two) times daily as needed for moderate pain. Take with food   escitalopram 10 MG tablet Commonly known as: LEXAPRO Take 1 tablet (10 mg total) by mouth daily.   sildenafil  20 MG tablet Commonly known as: REVATIO Take 1-5 tablets (20-100 mg total) by mouth as needed.        Objective:   BP 136/89   Pulse 86   Ht 6' (1.829 m)   Wt 211 lb (95.7 kg)   SpO2 99%   BMI 28.62 kg/m   Wt Readings from Last 3 Encounters:  11/12/20 211 lb (95.7 kg)  05/16/20 212 lb (96.2 kg)  11/15/19 216 lb (98 kg)    Physical Exam Vitals and nursing note reviewed.  Constitutional:      General: He is not in acute  distress.    Appearance: He is well-developed. He is not diaphoretic.  Eyes:     General: No scleral icterus.    Conjunctiva/sclera: Conjunctivae normal.  Neck:     Thyroid: No thyromegaly.  Cardiovascular:     Rate and Rhythm: Normal rate and regular rhythm.     Heart sounds: Normal heart sounds. No murmur heard.   Pulmonary:     Effort: Pulmonary effort is normal. No respiratory distress.     Breath sounds: Normal breath sounds. No wheezing.  Musculoskeletal:        General: No tenderness (No lower back tenderness on exam today.).     Cervical back: Neck supple.  Lymphadenopathy:     Cervical: No cervical adenopathy.  Skin:    General: Skin is warm and dry.     Findings: No rash.  Neurological:     Mental Status: He is alert and oriented to person, place, and time.     Coordination: Coordination normal.  Psychiatric:        Behavior: Behavior normal.       Assessment & Plan:   Problem List Items Addressed This Visit      Cardiovascular and Mediastinum   Essential hypertension   Relevant Orders   CMP14+EGFR     Other   GAD (generalized anxiety disorder)   Relevant Medications   clonazePAM (KLONOPIN) 1 MG tablet   Other Relevant Orders   ToxASSURE Select 13 (MW), Urine   Depression   Relevant Medications   clonazePAM (KLONOPIN) 1 MG tablet   Other Relevant Orders   ToxASSURE Select 13 (MW), Urine   Mixed hyperlipidemia - Primary   Relevant Orders   CMP14+EGFR   Lipid panel    Other Visit Diagnoses    Lumbar paraspinal muscle spasm       Relevant Medications   baclofen (LIORESAL) 10 MG tablet      Gave a prescription for baclofen, refilled his clonazepam.  Continue current medication, will check cholesterol.  Likely will start Crestor if cholesterol still elevated and may need something for triglycerides as well. Follow up plan: Return in about 6 months (around 05/14/2021), or if symptoms worsen or fail to improve, for Physical exam and recheck  hypertension and anxiety and cholesterol.  Counseling provided for all of the vaccine components No orders of the defined types were placed in this encounter.    , MD [facility] 11/12/2020, 3:31 PM     

## 2020-11-13 LAB — LIPID PANEL
Chol/HDL Ratio: 7.8 ratio — ABNORMAL HIGH (ref 0.0–5.0)
Cholesterol, Total: 242 mg/dL — ABNORMAL HIGH (ref 100–199)
HDL: 31 mg/dL — ABNORMAL LOW (ref >39)
LDL Chol Calc (NIH): 102 mg/dL — ABNORMAL HIGH (ref 0–99)
Triglycerides: 638 mg/dL (ref 0–149)
VLDL Cholesterol Cal: 109 mg/dL — ABNORMAL HIGH (ref 5–40)

## 2020-11-13 LAB — CMP14+EGFR
ALT: 20 IU/L (ref 0–44)
AST: 17 IU/L (ref 0–40)
Albumin/Globulin Ratio: 2 (ref 1.2–2.2)
Albumin: 4.5 g/dL (ref 3.8–4.9)
Alkaline Phosphatase: 70 IU/L (ref 44–121)
BUN/Creatinine Ratio: 20 (ref 9–20)
BUN: 18 mg/dL (ref 6–24)
Bilirubin Total: <0.2 mg/dL (ref 0.0–1.2)
CO2: 21 mmol/L (ref 20–29)
Calcium: 9.4 mg/dL (ref 8.7–10.2)
Chloride: 100 mmol/L (ref 96–106)
Creatinine, Ser: 0.89 mg/dL (ref 0.76–1.27)
Globulin, Total: 2.3 g/dL (ref 1.5–4.5)
Glucose: 82 mg/dL (ref 65–99)
Potassium: 4.4 mmol/L (ref 3.5–5.2)
Sodium: 138 mmol/L (ref 134–144)
Total Protein: 6.8 g/dL (ref 6.0–8.5)
eGFR: 101 mL/min/{1.73_m2} (ref >59)

## 2020-11-19 ENCOUNTER — Other Ambulatory Visit: Payer: Self-pay | Admitting: Family Medicine

## 2020-11-19 LAB — TOXASSURE SELECT 13 (MW), URINE

## 2020-11-19 MED ORDER — FENOFIBRATE 145 MG PO TABS: 145.0000 mg | ORAL_TABLET | Freq: Every day | ORAL | 3 refills | Status: AC

## 2020-12-03 ENCOUNTER — Other Ambulatory Visit: Payer: Self-pay

## 2020-12-03 ENCOUNTER — Telehealth: Payer: Self-pay

## 2020-12-03 ENCOUNTER — Other Ambulatory Visit: Payer: Self-pay | Admitting: Family Medicine

## 2020-12-03 DIAGNOSIS — F3341 Major depressive disorder, recurrent, in partial remission: Secondary | ICD-10-CM

## 2020-12-03 DIAGNOSIS — F411 Generalized anxiety disorder: Secondary | ICD-10-CM

## 2020-12-03 MED ORDER — ESCITALOPRAM OXALATE 10 MG PO TABS: 10.0000 mg | ORAL_TABLET | Freq: Every day | ORAL | 3 refills | Status: AC

## 2021-04-18 DIAGNOSIS — J189 Pneumonia, unspecified organism: Secondary | ICD-10-CM

## 2021-04-18 HISTORY — DX: Pneumonia, unspecified organism: J18.9

## 2021-04-30 DIAGNOSIS — Z6828 Body mass index (BMI) 28.0-28.9, adult: Secondary | ICD-10-CM | POA: Diagnosis not present

## 2021-04-30 DIAGNOSIS — G44209 Tension-type headache, unspecified, not intractable: Secondary | ICD-10-CM | POA: Diagnosis not present

## 2021-04-30 DIAGNOSIS — J329 Chronic sinusitis, unspecified: Secondary | ICD-10-CM | POA: Diagnosis not present

## 2021-04-30 DIAGNOSIS — J029 Acute pharyngitis, unspecified: Secondary | ICD-10-CM | POA: Diagnosis not present

## 2021-04-30 DIAGNOSIS — R059 Cough, unspecified: Secondary | ICD-10-CM | POA: Diagnosis not present

## 2021-05-01 ENCOUNTER — Ambulatory Visit: Payer: BC Managed Care – PPO | Admitting: Family Medicine

## 2021-05-01 ENCOUNTER — Emergency Department (HOSPITAL_COMMUNITY)
Admission: EM | Admit: 2021-05-01 | Discharge: 2021-05-01 | Disposition: A | Discharge status: Home / Self Care | Payer: BC Managed Care – PPO | Attending: Emergency Medicine | Admitting: Emergency Medicine

## 2021-05-01 ENCOUNTER — Encounter: Payer: Self-pay | Admitting: Family Medicine

## 2021-05-01 ENCOUNTER — Emergency Department (HOSPITAL_COMMUNITY): Payer: BC Managed Care – PPO

## 2021-05-01 ENCOUNTER — Other Ambulatory Visit: Payer: Self-pay

## 2021-05-01 ENCOUNTER — Ambulatory Visit (INDEPENDENT_AMBULATORY_CARE_PROVIDER_SITE_OTHER): Payer: BC Managed Care – PPO

## 2021-05-01 ENCOUNTER — Encounter (HOSPITAL_COMMUNITY): Payer: Self-pay | Admitting: Emergency Medicine

## 2021-05-01 VITALS — BP 156/93 | HR 135 | Temp 104.1°F

## 2021-05-01 DIAGNOSIS — Z20822 Contact with and (suspected) exposure to covid-19: Secondary | ICD-10-CM | POA: Diagnosis not present

## 2021-05-01 DIAGNOSIS — R051 Acute cough: Secondary | ICD-10-CM | POA: Diagnosis not present

## 2021-05-01 DIAGNOSIS — R000 Tachycardia, unspecified: Secondary | ICD-10-CM | POA: Insufficient documentation

## 2021-05-01 DIAGNOSIS — Z79899 Other long term (current) drug therapy: Secondary | ICD-10-CM | POA: Diagnosis not present

## 2021-05-01 DIAGNOSIS — I10 Essential (primary) hypertension: Secondary | ICD-10-CM | POA: Diagnosis not present

## 2021-05-01 DIAGNOSIS — M7918 Myalgia, other site: Secondary | ICD-10-CM | POA: Diagnosis not present

## 2021-05-01 DIAGNOSIS — J189 Pneumonia, unspecified organism: Secondary | ICD-10-CM | POA: Insufficient documentation

## 2021-05-01 DIAGNOSIS — R059 Cough, unspecified: Secondary | ICD-10-CM | POA: Diagnosis not present

## 2021-05-01 DIAGNOSIS — F17210 Nicotine dependence, cigarettes, uncomplicated: Secondary | ICD-10-CM | POA: Diagnosis not present

## 2021-05-01 DIAGNOSIS — R0902 Hypoxemia: Secondary | ICD-10-CM | POA: Diagnosis not present

## 2021-05-01 DIAGNOSIS — R509 Fever, unspecified: Secondary | ICD-10-CM

## 2021-05-01 DIAGNOSIS — R52 Pain, unspecified: Secondary | ICD-10-CM | POA: Diagnosis not present

## 2021-05-01 DIAGNOSIS — R519 Headache, unspecified: Secondary | ICD-10-CM | POA: Insufficient documentation

## 2021-05-01 LAB — COMPREHENSIVE METABOLIC PANEL
ALT: 58 U/L — ABNORMAL HIGH (ref 0–44)
AST: 56 U/L — ABNORMAL HIGH (ref 15–41)
Albumin: 3.5 g/dL (ref 3.5–5.0)
Alkaline Phosphatase: 61 U/L (ref 38–126)
Anion gap: 10 (ref 5–15)
BUN: 18 mg/dL (ref 6–20)
CO2: 22 mmol/L (ref 22–32)
Calcium: 8.7 mg/dL — ABNORMAL LOW (ref 8.9–10.3)
Chloride: 100 mmol/L (ref 98–111)
Creatinine, Ser: 1.16 mg/dL (ref 0.61–1.24)
GFR, Estimated: >60 mL/min (ref >60)
Glucose, Bld: 102 mg/dL — ABNORMAL HIGH (ref 70–99)
Potassium: 3.6 mmol/L (ref 3.5–5.1)
Sodium: 132 mmol/L — ABNORMAL LOW (ref 135–145)
Total Bilirubin: 0.5 mg/dL (ref 0.3–1.2)
Total Protein: 7.6 g/dL (ref 6.5–8.1)

## 2021-05-01 LAB — PROTIME-INR
INR: 1.1 (ref 0.8–1.2)
Prothrombin Time: 14.1 seconds (ref 11.4–15.2)

## 2021-05-01 LAB — CBC WITH DIFFERENTIAL/PLATELET
Abs Immature Granulocytes: 0.06 10*3/uL (ref 0.00–0.07)
Basophils Absolute: 0 10*3/uL (ref 0.0–0.1)
Basophils Relative: 0 %
Eosinophils Absolute: 0 10*3/uL (ref 0.0–0.5)
Eosinophils Relative: 0 %
HCT: 40.2 % (ref 39.0–52.0)
Hemoglobin: 14 g/dL (ref 13.0–17.0)
Immature Granulocytes: 1 %
Lymphocytes Relative: 7 %
Lymphs Abs: 0.7 10*3/uL (ref 0.7–4.0)
MCH: 31.7 pg (ref 26.0–34.0)
MCHC: 34.8 g/dL (ref 30.0–36.0)
MCV: 91.2 fL (ref 80.0–100.0)
Monocytes Absolute: 1.1 10*3/uL — ABNORMAL HIGH (ref 0.1–1.0)
Monocytes Relative: 11 %
Neutro Abs: 8.2 10*3/uL — ABNORMAL HIGH (ref 1.7–7.7)
Neutrophils Relative %: 81 %
Platelets: 209 10*3/uL (ref 150–400)
RBC: 4.41 MIL/uL (ref 4.22–5.81)
RDW: 11.9 % (ref 11.5–15.5)
WBC: 10.1 10*3/uL (ref 4.0–10.5)
nRBC: 0 % (ref 0.0–0.2)

## 2021-05-01 LAB — URINALYSIS, ROUTINE W REFLEX MICROSCOPIC
Bacteria, UA: NONE SEEN
Bilirubin Urine: NEGATIVE
Glucose, UA: NEGATIVE mg/dL
Ketones, ur: NEGATIVE mg/dL
Leukocytes,Ua: NEGATIVE
Nitrite: NEGATIVE
Protein, ur: 30 mg/dL — AB
Specific Gravity, Urine: 1.015 (ref 1.005–1.030)
pH: 6 (ref 5.0–8.0)

## 2021-05-01 LAB — RESP PANEL BY RT-PCR (FLU A&B, COVID) ARPGX2
Influenza A by PCR: NEGATIVE
Influenza B by PCR: NEGATIVE
SARS Coronavirus 2 by RT PCR: NEGATIVE

## 2021-05-01 LAB — LACTIC ACID, PLASMA
Lactic Acid, Venous: 1.2 mmol/L (ref 0.5–1.9)
Lactic Acid, Venous: 0.8 mmol/L (ref 0.5–1.9)

## 2021-05-01 LAB — VERITOR FLU A/B WAIVED
Influenza A: NEGATIVE
Influenza B: NEGATIVE

## 2021-05-01 MED ORDER — SODIUM CHLORIDE 0.9 % IV BOLUS
1000.0000 mL | Freq: Once | INTRAVENOUS | Status: AC
  Administered 2021-05-01: 1000 mL via INTRAVENOUS

## 2021-05-01 MED ORDER — AZITHROMYCIN 250 MG PO TABS
500.0000 mg | ORAL_TABLET | Freq: Once | ORAL | Status: AC
  Administered 2021-05-01: 500 mg via ORAL
  Filled 2021-05-01: qty 2

## 2021-05-01 MED ORDER — KETOROLAC TROMETHAMINE 15 MG/ML IJ SOLN
15.0000 mg | Freq: Once | INTRAMUSCULAR | Status: AC
  Administered 2021-05-01: 15 mg via INTRAVENOUS
  Filled 2021-05-01: qty 1

## 2021-05-01 MED ORDER — ACETAMINOPHEN 325 MG PO TABS
650.0000 mg | ORAL_TABLET | Freq: Once | ORAL | Status: AC | PRN
  Administered 2021-05-01: 650 mg via ORAL
  Filled 2021-05-01: qty 2

## 2021-05-01 MED ORDER — ACETAMINOPHEN 325 MG PO TABS
650.0000 mg | ORAL_TABLET | Freq: Once | ORAL | Status: AC
  Administered 2021-05-01: 650 mg via ORAL
  Filled 2021-05-01: qty 2

## 2021-05-01 MED ORDER — DROPERIDOL 2.5 MG/ML IJ SOLN
2.5000 mg | Freq: Once | INTRAMUSCULAR | Status: AC
  Administered 2021-05-01: 2.5 mg via INTRAVENOUS
  Filled 2021-05-01: qty 2

## 2021-05-01 MED ORDER — GUAIFENESIN 100 MG/5ML PO SYRP: 100.0000 mg | ORAL_SOLUTION | ORAL | 0 refills | Status: AC | PRN

## 2021-05-01 MED ORDER — AZITHROMYCIN 250 MG PO TABS: 250.0000 mg | ORAL_TABLET | Freq: Every day | ORAL | 0 refills | Status: AC

## 2021-05-01 MED ORDER — SODIUM CHLORIDE 0.9 % IV SOLN
1.0000 g | Freq: Once | INTRAVENOUS | Status: AC
  Administered 2021-05-01: 1 g via INTRAVENOUS
  Filled 2021-05-01: qty 10

## 2021-05-01 MED ORDER — AZITHROMYCIN 250 MG PO TABS: 500.0000 mg | ORAL_TABLET | Freq: Every day | ORAL | Status: DC

## 2021-05-01 NOTE — ED Triage Notes (Signed)
Pt to the ED [facility] from [facility] with a fever and a dx of PNU.  Fever on arrival is 103.1 oral. Pt has been feeling bad for a week.

## 2021-05-01 NOTE — Discharge Instructions (Addendum)
Your x-ray shows a pneumonia.  Continue oral hydration with Gatorade or other electrolyte fluids.  Avoid strenuous activity.  Take the antibiotics as written.    You can use a finger oxygen meter you can purchase at [facility] or [facility] to measure your oxygenation.  If it drops below 90% you will need to return to the ER for reevaluation.  Otherwise continue outpatient follow-up with your doctor within the next 3 to 4 days.  Return immediately to the ER if you have worsening symptoms pain or are not improving.

## 2021-05-01 NOTE — ED Provider Notes (Signed)
[facility] EMERGENCY DEPARTMENT Provider Note   CSN: [id number] Arrival date & time: 05/01/21  1740     History Chief Complaint  Patient presents with   Code Sepsis    Benjamin Zamora is a 55 y.o. male.  Patient presents with fever cough generalized body aches malaise ongoing for a week.  He was seen at outpatient facility had COVID test were sent which were unremarkable.  He was sent to the ER with findings concerning for pneumonia.  Patient otherwise describes sharp chest pain only when he coughs.  Describes headache and body aches.  Denies any vomiting or diarrhea.  He was given a shot of an antibiotic few days ago without improvement and so presents to the ER today.      Past Medical History:  Diagnosis Date   Anxiety    Depression    Drug abuse ([place]) 1995   Stopped in 1995   Hyperlipidemia     Patient Active Problem List   Diagnosis Date Noted   Cough 07/30/2020   Essential hypertension 09/08/2018   Tobacco use 09/08/2018   Mixed hyperlipidemia 09/08/2018   Controlled substance agreement signed 03/16/2018   GAD (generalized anxiety disorder) 12/11/2014   Depression 12/11/2014    Past Surgical History:  Procedure Laterality Date   APPENDECTOMY     EYE SURGERY         Family History  Problem Relation Age of Onset   Cancer Mother    Thyroid disease Mother    Hypertension Father    Thyroid disease Brother    Colon cancer Neg Hx     Social History   Tobacco Use   Smoking status: Every Day    Packs/day: 1.00    Types: Cigarettes   Smokeless tobacco: Never  Vaping Use   Vaping Use: Never used  Substance Use Topics   Alcohol use: Yes    Alcohol/week: 3.0 - 6.0 standard drinks    Types: 3 - 6 Cans of beer per week   Drug use: No    Home Medications Prior to Admission medications   Medication Sig Start Date End Date Taking? Authorizing Provider  acetaminophen (TYLENOL) 160 MG/5ML liquid Take by mouth daily as needed for fever. 12.5 ml by  mouth as needed   Yes [provider]  amoxicillin (AMOXIL) 400 MG/5ML suspension Take by mouth. 12.mls by mouth twice daily 04/30/21  Yes [provider]  azithromycin (ZITHROMAX Z-PAK) 250 MG tablet Take 1 tablet (250 mg total) by mouth daily for 5 days. 05/01/21 05/06/21 Yes Luna Fuse, MD  baclofen (LIORESAL) 10 MG tablet Take 1 tablet (10 mg total) by mouth 3 (three) times daily as needed for muscle spasms. 11/12/20  Yes Dettinger, Fransisca Kaufmann, MD  benzonatate (TESSALON) 100 MG capsule Take 200 mg by mouth 3 (three) times daily as needed. 04/30/21  Yes [provider]  clonazePAM (KLONOPIN) 1 MG tablet Take 1 tablet (1 mg total) by mouth daily as needed. Please disregard all previous prescriptions 11/12/20  Yes Dettinger, Fransisca Kaufmann, MD  escitalopram (LEXAPRO) 10 MG tablet Take 1 tablet (10 mg total) by mouth daily. 12/03/20  Yes Dettinger, Fransisca Kaufmann, MD  fenofibrate (TRICOR) 145 MG tablet Take 1 tablet (145 mg total) by mouth daily. 11/19/20  Yes Dettinger, Fransisca Kaufmann, MD  ibuprofen (ADVIL) 100 MG/5ML suspension Take 200 mg by mouth daily as needed for fever. 12.5 mls as needed for fever   Yes [provider]  ipratropium (ATROVENT) 0.06 %  nasal spray Place 2 sprays into both nostrils 3 (three) times daily. 04/30/21  Yes [provider]  sildenafil (REVATIO) 20 MG tablet Take 1-5 tablets (20-100 mg total) by mouth as needed. 09/28/19  Yes Dettinger, Fransisca Kaufmann, MD  diclofenac (VOLTAREN) 75 MG EC tablet Take 1 tablet (75 mg total) by mouth 2 (two) times daily as needed for moderate pain. Take with food Patient not taking: No sig reported 06/18/20   Janora Norlander, DO    Allergies    Patient has no known allergies.  Review of Systems   Review of Systems  Constitutional:  Negative for fever.  HENT:  Negative for ear pain and sore throat.   Eyes:  Negative for pain.  Respiratory:  Negative for cough.   Cardiovascular:  Positive for chest pain.   Gastrointestinal:  Negative for abdominal pain.  Genitourinary:  Negative for flank pain.  Musculoskeletal:  Negative for back pain.  Skin:  Negative for color change and rash.  Neurological:  Positive for headaches. Negative for syncope.  All other systems reviewed and are negative.  Physical Exam Updated Vital Signs BP 120/82   Pulse 95   Temp 98.6 F (37 C) (Oral)   Resp (!) 22   Ht 6' (1.829 m)   Wt 95.7 kg   SpO2 93%   BMI 28.61 kg/m   Physical Exam Constitutional:      Appearance: He is well-developed.  HENT:     Head: Normocephalic.     Nose: Nose normal.  Eyes:     Extraocular Movements: Extraocular movements intact.  Cardiovascular:     Rate and Rhythm: Tachycardia present.  Pulmonary:     Effort: Pulmonary effort is normal.  Skin:    Coloration: Skin is not jaundiced.  Neurological:     General: No focal deficit present.     Mental Status: He is alert and oriented to person, place, and time. Mental status is at baseline.     Cranial Nerves: No cranial nerve deficit.     Sensory: No sensory deficit.     Motor: No weakness.     Coordination: Coordination normal.     Gait: Gait normal.    ED Results / Procedures / Treatments   Labs (all labs ordered are listed, but only abnormal results are displayed) Labs Reviewed  COMPREHENSIVE METABOLIC PANEL - Abnormal; Notable for the following components:      Result Value   Sodium 132 (*)    Glucose, Bld 102 (*)    Calcium 8.7 (*)    AST 56 (*)    ALT 58 (*)    All other components within normal limits  CBC WITH DIFFERENTIAL/PLATELET - Abnormal; Notable for the following components:   Neutro Abs 8.2 (*)    Monocytes Absolute 1.1 (*)    All other components within normal limits  URINALYSIS, ROUTINE W REFLEX MICROSCOPIC - Abnormal; Notable for the following components:   Hgb urine dipstick SMALL (*)    Protein, ur 30 (*)    All other components within normal limits  RESP PANEL BY RT-PCR (FLU A&B, COVID)  ARPGX2  CULTURE, BLOOD (ROUTINE X 2)  CULTURE, BLOOD (ROUTINE X 2)  LACTIC ACID, PLASMA  LACTIC ACID, PLASMA  PROTIME-INR    EKG EKG Interpretation  Date/Time:  Friday May 01 2021 17:54:18 EDT Ventricular Rate:  124 PR Interval:  108 QRS Duration: 79 QT Interval:  278 QTC Calculation: 400 R Axis:   34 Text Interpretation: Sinus tachycardia  Confirmed by Thamas Jaegers [phone]) on 05/01/2021 8:04:23 PM  Radiology DG Chest 2 View  Result Date: 05/01/2021 CLINICAL DATA:  Cough EXAM: CHEST - 2 VIEW COMPARISON:  06/01/2011, report only. FINDINGS: Mild right hemidiaphragm elevation. Midline trachea. Normal heart size and mediastinal contours. No pleural effusion or pneumothorax. Mild pulmonary interstitial thickening. Right upper lobe consolidation IMPRESSION: Right upper lobe consolidation, favoring pneumonia. Followup PA and lateral chest X-ray is recommended in 3-4 weeks following trial of antibiotic therapy to ensure resolution and exclude underlying malignancy. Peribronchial thickening which may relate to chronic bronchitis or smoking. Electronically Signed   By: Abigail Miyamoto M.D.   On: 05/01/2021 16:58   CT Head Wo Contrast  Result Date: 05/01/2021 CLINICAL DATA:  Headaches and fevers EXAM: CT HEAD WITHOUT CONTRAST TECHNIQUE: Contiguous axial images were obtained from the base of the skull through the vertex without intravenous contrast. COMPARISON:  None. FINDINGS: Brain: No evidence of acute infarction, hemorrhage, hydrocephalus, extra-axial collection or mass lesion/mass effect. Vascular: No hyperdense vessel or unexpected calcification. Skull: Normal. Negative for fracture or focal lesion. Sinuses/Orbits: Mild mucosal thickening is noted within the right maxillary antrum. Other: None. IMPRESSION: Mild sinus disease as described. No acute intracranial abnormality noted. Electronically Signed   By: Inez Catalina M.D.   On: 05/01/2021 21:37    Procedures Procedures   Medications  Ordered in ED Medications  acetaminophen (TYLENOL) tablet 650 mg (650 mg Oral Given 05/01/21 1801)  ketorolac (TORADOL) 15 MG/ML injection 15 mg (15 mg Intravenous Given 05/01/21 1827)  sodium chloride 0.9 % bolus 1,000 mL (0 mLs Intravenous Stopped 05/01/21 2123)  cefTRIAXone (ROCEPHIN) 1 g in sodium chloride 0.9 % 100 mL IVPB (0 g Intravenous Stopped 05/01/21 1910)  sodium chloride 0.9 % bolus 1,000 mL (0 mLs Intravenous Stopped 05/01/21 2124)  ketorolac (TORADOL) 15 MG/ML injection 15 mg (15 mg Intravenous Given 05/01/21 2002)  acetaminophen (TYLENOL) tablet 650 mg (650 mg Oral Given 05/01/21 2002)  droperidol (INAPSINE) 2.5 MG/ML injection 2.5 mg (2.5 mg Intravenous Given 05/01/21 2117)  azithromycin (ZITHROMAX) tablet 500 mg (500 mg Oral Given 05/01/21 2206)    ED Course  I have reviewed the triage vital signs and the nursing notes.  Pertinent labs & imaging results that were available during my care of the patient were reviewed by me and considered in my medical decision making (see chart for details).    MDM Rules/Calculators/A&P                           Patient presents to ER tachycardic and febrile.  Blood pressure appears stable.  Work-up consistent with pneumonia.  However labs are unremarkable white count is normal chemistry is normal lactic acid is normal.  Vital signs appear improved after fluid resuscitation.  O2 saturation ranges from 91 to 95% on room air currently 93% on room air which is appropriate.  Chest x-ray consistent with pneumonia reviewed from outside film.  I do lengthy discussion with the patient who prefers to follow on an outpatient basis given improvement of vital signs.  He did mention headache but he has no neck pain full range of motion without pain no meningismal signs.  We discussed benefit of lumbar puncture regarding possible meningitis.  However patient declined the procedure.  Symptoms appeared improved with medications provided in the ER.  Risk  of missed meningitis discussed, however clinically I suspect this is less likely.  CT imaging unremarkable.  Advised outpatient follow-up with  his doctor in 3 to 4 days advised pulse oximeter use at home and to return if abnormal.  Advised immediate return for difficulty breathing worsening symptoms or any additional concerns.    Final Clinical Impression(s) / ED Diagnoses Final diagnoses:  Community acquired pneumonia, unspecified laterality    Rx / DC Orders ED Discharge Orders          Ordered    azithromycin (ZITHROMAX Z-PAK) 250 MG tablet  Daily        05/01/21 2159             Luna Fuse, MD 05/01/21 2210

## 2021-05-01 NOTE — Progress Notes (Signed)
Subjective:  Patient ID: Benjamin Zamora, male    DOB: 1966-03-01  Age: 55 y.o. MRN: [id number]  CC: Cough, Fever, Headache, and Generalized Body Aches   HPI [name] presents for 4 days of fever to 105 degrees. Weak, coughing, dyspneic. All over body aches. Had negative flu and Covid rapid test at work. PCR test from Urgent care is pending from yesterday. Seen at Urgent care and given steroid and antibiotic shots. Has severe, ponding all over HA. Can hardly talk due to severity of cough. Nonproductive.  Depression screen [facility] 2/9 11/12/2020 07/30/2020 05/16/2020  Decreased Interest 0 0 0  Down, Depressed, Hopeless 0 0 0  PHQ - 2 Score 0 0 0  Altered sleeping - - -  Tired, decreased energy - - -  Change in appetite - - -  Feeling bad or failure about yourself  - - -  Trouble concentrating - - -  Moving slowly or fidgety/restless - - -  Suicidal thoughts - - -  PHQ-9 Score - - -    History Nestor has a past medical history of Anxiety, Depression, Drug abuse ([place]) (1995), and Hyperlipidemia.   He has a past surgical history that includes Appendectomy and Eye surgery.   His family history includes Cancer in his mother; Hypertension in his father; Thyroid disease in his brother and mother.He reports that he has been smoking cigarettes. He has been smoking an average of 1.5 packs per day. He has never used smokeless tobacco. He reports current alcohol use of about 6.0 - 8.0 standard drinks per week. He reports that he does not use drugs.    ROS Review of Systems  Constitutional:  Positive for activity change, appetite change, chills, diaphoresis, fatigue and fever.  HENT:  Positive for congestion, postnasal drip, rhinorrhea and sinus pressure. Negative for ear discharge, ear pain, hearing loss, nosebleeds, sneezing and trouble swallowing.   Respiratory:  Positive for cough, chest tightness and shortness of breath.   Cardiovascular:  Negative for chest pain and  palpitations.  Skin:  Negative for rash.   Objective:  BP (!) 156/93   Pulse (!) 135   Temp (!) 104.1 F (40.1 C)   SpO2 95%   BP Readings from Last 3 Encounters:  05/01/21 (!) 156/93  11/12/20 136/89  07/30/20 132/90    Wt Readings from Last 3 Encounters:  11/12/20 211 lb (95.7 kg)  05/16/20 212 lb (96.2 kg)  11/15/19 216 lb (98 kg)     Physical Exam Constitutional:      General: He is in acute distress.     Appearance: He is well-developed and normal weight. He is ill-appearing, toxic-appearing and diaphoretic.  HENT:     Head: Normocephalic and atraumatic.     Right Ear: External ear normal.     Left Ear: External ear normal.     Nose: Nose normal.  Eyes:     Conjunctiva/sclera: Conjunctivae normal.     Pupils: Pupils are equal, round, and reactive to light.  Cardiovascular:     Rate and Rhythm: Regular rhythm. Tachycardia present.     Heart sounds: Normal heart sounds. No murmur heard. Pulmonary:     Effort: Pulmonary effort is normal. No respiratory distress.     Breath sounds: Wheezing (RUL) and rhonchi (RUL) present. No rales.  Abdominal:     Palpations: Abdomen is soft.     Tenderness: There is no abdominal tenderness.  Musculoskeletal:        General: Normal range of  motion.     Cervical back: Normal range of motion and neck supple.  Skin:    General: Skin is warm.  Neurological:     Mental Status: He is alert and oriented to person, place, and time.     Deep Tendon Reflexes: Reflexes are normal and symmetric.  Psychiatric:        Behavior: Behavior normal.        Thought Content: Thought content normal.        Judgment: Judgment normal.     CXR - RUL infiltrate/consolidation, confirmed by radiology final report. Assessment & Plan:   Earle was seen today for cough, fever, headache and generalized body aches.  Diagnoses and all orders for this visit:  Acute cough -     DG Chest 2 View; Future -     Veritor Flu A/B Waived  Fever,  unspecified fever cause -     Veritor Flu A/B Waived    Pt. Transferred via EMS to [facility].   I am having [name] maintain his sildenafil, diclofenac, clonazePAM, baclofen, fenofibrate, and escitalopram.  Allergies as of 05/01/2021   No Known Allergies      Medication List        Accurate as of May 01, 2021  4:47 PM. If you have any questions, ask your nurse or doctor.          baclofen 10 MG tablet Commonly known as: LIORESAL Take 1 tablet (10 mg total) by mouth 3 (three) times daily as needed for muscle spasms.   clonazePAM 1 MG tablet Commonly known as: KLONOPIN Take 1 tablet (1 mg total) by mouth daily as needed. Please disregard all previous prescriptions   diclofenac 75 MG EC tablet Commonly known as: VOLTAREN Take 1 tablet (75 mg total) by mouth 2 (two) times daily as needed for moderate pain. Take with food   escitalopram 10 MG tablet Commonly known as: LEXAPRO Take 1 tablet (10 mg total) by mouth daily.   fenofibrate 145 MG tablet Commonly known as: TRICOR Take 1 tablet (145 mg total) by mouth daily.   sildenafil 20 MG tablet Commonly known as: REVATIO Take 1-5 tablets (20-100 mg total) by mouth as needed.         Follow-up: No follow-ups on file.  Claretta Fraise, M.D.

## 2021-05-01 NOTE — ED Notes (Signed)
EDP made aware of sepsis symptoms

## 2021-05-04 ENCOUNTER — Ambulatory Visit: Payer: BC Managed Care – PPO | Admitting: Family Medicine

## 2021-05-04 ENCOUNTER — Encounter: Payer: Self-pay | Admitting: Family Medicine

## 2021-05-04 ENCOUNTER — Ambulatory Visit (INDEPENDENT_AMBULATORY_CARE_PROVIDER_SITE_OTHER): Payer: BC Managed Care – PPO | Admitting: Family Medicine

## 2021-05-04 VITALS — BP 133/91 | HR 92 | Temp 97.3°F

## 2021-05-04 DIAGNOSIS — R0602 Shortness of breath: Secondary | ICD-10-CM

## 2021-05-04 DIAGNOSIS — J189 Pneumonia, unspecified organism: Secondary | ICD-10-CM

## 2021-05-04 DIAGNOSIS — R0902 Hypoxemia: Secondary | ICD-10-CM | POA: Diagnosis not present

## 2021-05-04 MED ORDER — ALBUTEROL SULFATE HFA 108 (90 BASE) MCG/ACT IN AERS: 2.0000 | INHALATION_SPRAY | Freq: Four times a day (QID) | RESPIRATORY_TRACT | 2 refills | Status: AC | PRN

## 2021-05-04 MED ORDER — GUAIFENESIN ER 600 MG PO TB12: 600.0000 mg | ORAL_TABLET | Freq: Two times a day (BID) | ORAL | 0 refills | Status: DC

## 2021-05-04 NOTE — Progress Notes (Signed)
Assessment & Plan:  1. Pneumonia of right upper lobe due to infectious organism Continue antibiotic. Patient declined steroids; encouraged to take Mucinex twice daily. Discussed he needs to wear oxygen, especially with activity due to dropping oxygen saturation during his walk after just a minute or so. Also discussed weaning of oxygen once he is feeling better. Albuterol prescribed for shortness of breath, wheezing, and/or feeling tight in the chest. Encouraged adequate hydration.  - albuterol (VENTOLIN HFA) 108 (90 Base) MCG/ACT inhaler; Inhale 2 puffs into the lungs every 6 (six) hours as needed.  Dispense: 18 g; Refill: 2 - guaiFENesin (MUCINEX) 600 MG 12 hr tablet; Take 1 tablet (600 mg total) by mouth 2 (two) times daily.  Dispense: 14 tablet; Refill: 0 - For home use only DME oxygen  2. Hypoxemia - For home use only DME oxygen  3. Shortness of breath - albuterol (VENTOLIN HFA) 108 (90 Base) MCG/ACT inhaler; Inhale 2 puffs into the lungs every 6 (six) hours as needed.  Dispense: 18 g; Refill: 2   Follow up plan: Return if symptoms worsen or fail to improve.  Hendricks Limes, MSN, APRN, FNP-C [facility]  Subjective:   Patient ID: Benjamin Zamora, male    DOB: 10-May-1966, 55 y.o.   MRN: [id number]  HPI: Benjamin Zamora is a 55 y.o. male presenting on 05/04/2021 for ER follow up (10/14- AP- PNA. C/O low O2, loss of appetite, fatigue, sob, wheezing  )  Patient is accompanied by his wife who he is okay with being present.   Patient was seen at [facility] on 04/30/2021 at which time he was treated for sinusitis with Rocephin and Decadron. He was started on Amoxicillin to complete at home. When his fever got up to 105 he was seen in our office where he was transferred to the ER. In the ER he was diagnosed with pneumonia. He was given Rocephin and discharged with Azithromycin. He has been taking antibiotics as prescribed. Today is the first day with no  fever. Wife reports his heart rate is staying 100-115 and his oxygen saturation is in the 80s unless she has him taking deep breaths. He states he is not worse, but he is not better. He is feeling very weak and experiencing a lot of shortness of breath. His is unable to get anything up with his cough.    ROS: Negative unless specifically indicated above in HPI.   Relevant past medical history reviewed and updated as indicated.   Allergies and medications reviewed and updated.   Current Outpatient Medications:    acetaminophen (TYLENOL) 160 MG/5ML liquid, Take by mouth daily as needed for fever. 12.5 ml by mouth as needed, Disp: , Rfl:    azithromycin (ZITHROMAX Z-PAK) 250 MG tablet, Take 1 tablet (250 mg total) by mouth daily for 5 days., Disp: 5 tablet, Rfl: 0   baclofen (LIORESAL) 10 MG tablet, Take 1 tablet (10 mg total) by mouth 3 (three) times daily as needed for muscle spasms., Disp: 30 each, Rfl: 2   benzonatate (TESSALON) 100 MG capsule, Take 200 mg by mouth 3 (three) times daily as needed., Disp: , Rfl:    clonazePAM (KLONOPIN) 1 MG tablet, Take 1 tablet (1 mg total) by mouth daily as needed. Please disregard all previous prescriptions, Disp: 30 tablet, Rfl: 5   diclofenac (VOLTAREN) 75 MG EC tablet, Take 1 tablet (75 mg total) by mouth 2 (two) times daily as needed for moderate pain. Take with food, Disp:  30 tablet, Rfl: 0   escitalopram (LEXAPRO) 10 MG tablet, Take 1 tablet (10 mg total) by mouth daily., Disp: 90 tablet, Rfl: 3   fenofibrate (TRICOR) 145 MG tablet, Take 1 tablet (145 mg total) by mouth daily., Disp: 90 tablet, Rfl: 3   guaifenesin (ROBITUSSIN) 100 MG/5ML syrup, Take 5-10 mLs (100-200 mg total) by mouth every 4 (four) hours as needed for cough., Disp: 60 mL, Rfl: 0   ibuprofen (ADVIL) 100 MG/5ML suspension, Take 200 mg by mouth daily as needed for fever. 12.5 mls as needed for fever, Disp: , Rfl:    ipratropium (ATROVENT) 0.06 % nasal spray, Place 2 sprays into both  nostrils 3 (three) times daily., Disp: , Rfl:    sildenafil (REVATIO) 20 MG tablet, Take 1-5 tablets (20-100 mg total) by mouth as needed., Disp: 30 tablet, Rfl: 3  No Known Allergies  Objective:   BP (!) 133/91   Pulse 92   Temp (!) 97.3 F (36.3 C) (Temporal)   SpO2 93% Comment: 2 L oxygen sitting   O2 at rest on room air: 93% O2 with ambulation during 6-minute walk: 16% O2 upon application of 2L via [place]: 94%  Physical Exam Vitals reviewed.  Constitutional:      General: He is not in acute distress.    Appearance: Normal appearance. He is not ill-appearing, toxic-appearing or diaphoretic.  HENT:     Head: Normocephalic and atraumatic.  Eyes:     General: No scleral icterus.       Right eye: No discharge.        Left eye: No discharge.     Conjunctiva/sclera: Conjunctivae normal.  Cardiovascular:     Rate and Rhythm: Normal rate and regular rhythm.     Heart sounds: Normal heart sounds. No murmur heard.   No friction rub. No gallop.  Pulmonary:     Effort: Pulmonary effort is normal. No respiratory distress.     Breath sounds: No stridor. Decreased breath sounds (right side) present. No wheezing, rhonchi or rales.  Musculoskeletal:        General: Normal range of motion.     Cervical back: Normal range of motion.  Skin:    General: Skin is warm and dry.  Neurological:     Mental Status: He is alert and oriented to person, place, and time. Mental status is at baseline.  Psychiatric:        Mood and Affect: Mood normal.        Behavior: Behavior normal.        Thought Content: Thought content normal.        Judgment: Judgment normal.

## 2021-05-06 LAB — CULTURE, BLOOD (ROUTINE X 2)
Culture: NO GROWTH
Culture: NO GROWTH
Special Requests: ADEQUATE
Special Requests: ADEQUATE

## 2021-05-07 ENCOUNTER — Telehealth: Payer: Self-pay | Admitting: Family Medicine

## 2021-05-07 NOTE — Telephone Encounter (Signed)
Letter printed and patient aware.

## 2021-05-07 NOTE — Telephone Encounter (Signed)
I am okay with this

## 2021-05-07 NOTE — Telephone Encounter (Signed)
Pts wife called stating that pt is still out of work with his Pneumonia and needs work note extended. Says pt is going to try and go back to work on Monday 10/24

## 2021-05-07 NOTE — Telephone Encounter (Signed)
Patient seen you please advise.

## 2021-05-08 DIAGNOSIS — Z029 Encounter for administrative examinations, unspecified: Secondary | ICD-10-CM

## 2021-05-14 ENCOUNTER — Ambulatory Visit (INDEPENDENT_AMBULATORY_CARE_PROVIDER_SITE_OTHER): Payer: BC Managed Care – PPO

## 2021-05-14 ENCOUNTER — Other Ambulatory Visit: Payer: Self-pay

## 2021-05-14 ENCOUNTER — Other Ambulatory Visit: Payer: Self-pay | Admitting: Family Medicine

## 2021-05-14 ENCOUNTER — Ambulatory Visit: Payer: BC Managed Care – PPO | Admitting: Family Medicine

## 2021-05-14 ENCOUNTER — Encounter: Payer: Self-pay | Admitting: Family Medicine

## 2021-05-14 VITALS — BP 114/76 | HR 98 | Ht 72.0 in | Wt 201.0 lb

## 2021-05-14 DIAGNOSIS — J189 Pneumonia, unspecified organism: Secondary | ICD-10-CM

## 2021-05-14 DIAGNOSIS — F3341 Major depressive disorder, recurrent, in partial remission: Secondary | ICD-10-CM

## 2021-05-14 DIAGNOSIS — E782 Mixed hyperlipidemia: Secondary | ICD-10-CM | POA: Diagnosis not present

## 2021-05-14 DIAGNOSIS — F411 Generalized anxiety disorder: Secondary | ICD-10-CM | POA: Diagnosis not present

## 2021-05-14 DIAGNOSIS — I10 Essential (primary) hypertension: Secondary | ICD-10-CM | POA: Diagnosis not present

## 2021-05-14 MED ORDER — ALBUTEROL SULFATE (2.5 MG/3ML) 0.083% IN NEBU: 2.5000 mg | INHALATION_SOLUTION | Freq: Four times a day (QID) | RESPIRATORY_TRACT | 1 refills | Status: AC | PRN

## 2021-05-14 MED ORDER — CLONAZEPAM 1 MG PO TABS: 1.0000 mg | ORAL_TABLET | Freq: Every day | ORAL | 5 refills | Status: DC | PRN

## 2021-05-14 NOTE — Progress Notes (Signed)
 BP 114/76   Pulse 98   Ht 6' (1.829 m)   Wt 201 lb (91.2 kg)   SpO2 94%   BMI 27.26 kg/m    Subjective:   Patient ID: Benjamin Zamora, male    DOB: 03/31/1966, 54 y.o.   MRN: [id number]  HPI: Benjamin Zamora is a 54 y.o. male presenting on 05/14/2021 for Medical Management of Chronic Issues, Hyperlipidemia, Hypertension, Depression, and o2 low (Has been sick for 2 weeks.  Asking for nebulizer)   HPI Hyperlipidemia Patient is coming in for recheck of his hyperlipidemia. The patient is currently taking fenofibrate. They deny any issues with myalgias or history of liver damage from it. They deny any focal numbness or weakness or chest pain.   Hypertension Patient is currently on no medication currently, monitoring, and their blood pressure today is 114/76. Patient denies any lightheadedness or dizziness. Patient denies headaches, blurred vision, chest pains, shortness of breath, or weakness. Denies any side effects from medication and is content with current medication.   Depression and anxiety recheck. Current rx-clonazepam 1 mg daily as needed for # meds rx- 30 Effectiveness of current meds-works well, does not use every day Adverse reactions form meds-none  Pill count performed-No Last drug screen - 11/19/20 ( high risk q3m, moderate risk q6m, low risk yearly ) Urine drug screen today- No Was the [facility] reviewed- yes  If yes were their any concerning findings? - none  No flowsheet data found.   Controlled substance contract signed on: 11/19/20   Patient was on oxygen because of pneumonia.  He still has hoarseness but otherwise he is feeling a lot better.  He still has some congestion but realistically he is feeling a lot better than he was and his oxygen today is 94% in the office.  He wants in order to stop the O2 but he would like an order for some nebulizer medicine because he does have a nebulizer that they gave him.  Relevant past medical, surgical, family and  social history reviewed and updated as indicated. Interim medical history since our last visit reviewed. Allergies and medications reviewed and updated.  Review of Systems  Constitutional:  Negative for chills and fever.  HENT:  Positive for congestion and voice change.   Respiratory:  Positive for cough. Negative for shortness of breath and wheezing.   Cardiovascular:  Negative for chest pain, palpitations and leg swelling.  Musculoskeletal:  Negative for back pain and gait problem.  Skin:  Negative for rash.  Neurological:  Negative for dizziness, weakness and light-headedness.  All other systems reviewed and are negative.  Per HPI unless specifically indicated above   Allergies as of 05/14/2021   No Known Allergies      Medication List        Accurate as of May 14, 2021 11:34 AM. If you have any questions, ask your nurse or doctor.          acetaminophen 160 MG/5ML liquid Commonly known as: TYLENOL Take by mouth daily as needed for fever. 12.5 ml by mouth as needed   albuterol 108 (90 Base) MCG/ACT inhaler Commonly known as: VENTOLIN HFA Inhale 2 puffs into the lungs every 6 (six) hours as needed.   baclofen 10 MG tablet Commonly known as: LIORESAL Take 1 tablet (10 mg total) by mouth 3 (three) times daily as needed for muscle spasms.   benzonatate 100 MG capsule Commonly known as: TESSALON Take 200 mg by mouth 3 (three) times daily as needed.     clonazePAM 1 MG tablet Commonly known as: KLONOPIN Take 1 tablet (1 mg total) by mouth daily as needed. Please disregard all previous prescriptions   diclofenac 75 MG EC tablet Commonly known as: VOLTAREN Take 1 tablet (75 mg total) by mouth 2 (two) times daily as needed for moderate pain. Take with food   escitalopram 10 MG tablet Commonly known as: LEXAPRO Take 1 tablet (10 mg total) by mouth daily.   fenofibrate 145 MG tablet Commonly known as: TRICOR Take 1 tablet (145 mg total) by mouth daily.    guaifenesin 100 MG/5ML syrup Commonly known as: ROBITUSSIN Take 5-10 mLs (100-200 mg total) by mouth every 4 (four) hours as needed for cough. What changed: Another medication with the same name was removed. Continue taking this medication, and follow the directions you see here. Changed by: Fransisca Kaufmann Yeiden Frenkel, MD   ibuprofen 100 MG/5ML suspension Commonly known as: ADVIL Take 200 mg by mouth daily as needed for fever. 12.5 mls as needed for fever   ipratropium 0.06 % nasal spray Commonly known as: ATROVENT Place 2 sprays into both nostrils 3 (three) times daily.   sildenafil 20 MG tablet Commonly known as: REVATIO Take 1-5 tablets (20-100 mg total) by mouth as needed.         Objective:   BP 114/76   Pulse 98   Ht 6' (1.829 m)   Wt 201 lb (91.2 kg)   SpO2 94%   BMI 27.26 kg/m   Wt Readings from Last 3 Encounters:  05/14/21 201 lb (91.2 kg)  05/01/21 210 lb 15.7 oz (95.7 kg)  11/12/20 211 lb (95.7 kg)    Physical Exam Vitals and nursing note reviewed.  Constitutional:      General: He is not in acute distress.    Appearance: He is well-developed. He is not diaphoretic.  Eyes:     General: No scleral icterus.    Conjunctiva/sclera: Conjunctivae normal.  Neck:     Thyroid: No thyromegaly.  Cardiovascular:     Rate and Rhythm: Normal rate and regular rhythm.     Heart sounds: Normal heart sounds. No murmur heard. Pulmonary:     Effort: Pulmonary effort is normal. No respiratory distress.     Breath sounds: Normal breath sounds. No wheezing, rhonchi or rales.  Chest:     Chest wall: No tenderness.  Musculoskeletal:        General: Normal range of motion.     Cervical back: Neck supple.  Lymphadenopathy:     Cervical: No cervical adenopathy.  Skin:    General: Skin is warm and dry.     Findings: No rash.  Neurological:     Mental Status: He is alert and oriented to person, place, and time.     Coordination: Coordination normal.  Psychiatric:         Behavior: Behavior normal.      Assessment & Plan:   Problem List Items Addressed This Visit       Cardiovascular and Mediastinum   Essential hypertension   Relevant Orders   CBC with Differential/Platelet   CMP14+EGFR     Other   GAD (generalized anxiety disorder)   Relevant Medications   clonazePAM (KLONOPIN) 1 MG tablet   Depression   Relevant Medications   clonazePAM (KLONOPIN) 1 MG tablet   Mixed hyperlipidemia - Primary   Relevant Orders   Lipid panel   Other Visit Diagnoses     Pneumonia of right upper lobe due to infectious  organism       Relevant Orders   DG Chest 2 View       Continue current meds, will do bloodwork  Chest x-ray pending Follow up plan: Return in about 6 months (around 11/12/2021), or if symptoms worsen or fail to improve, for Hypertension and depression and anxiety and cholesterol.  Counseling provided for all of the vaccine components Orders Placed This Encounter  Procedures   DG Chest 2 View   CBC with Differential/Platelet   CMP14+EGFR   Lipid panel     , MD [facility] 05/14/2021, 11:34 AM     

## 2021-05-15 LAB — CBC WITH DIFFERENTIAL/PLATELET
Basophils Absolute: 0.1 10*3/uL (ref 0.0–0.2)
Basos: 1 %
EOS (ABSOLUTE): 0.1 10*3/uL (ref 0.0–0.4)
Eos: 2 %
Hematocrit: 40.2 % (ref 37.5–51.0)
Hemoglobin: 14 g/dL (ref 13.0–17.7)
Immature Grans (Abs): 0.1 10*3/uL (ref 0.0–0.1)
Immature Granulocytes: 1 %
Lymphocytes Absolute: 1.9 10*3/uL (ref 0.7–3.1)
Lymphs: 25 %
MCH: 31.9 pg (ref 26.6–33.0)
MCHC: 34.8 g/dL (ref 31.5–35.7)
MCV: 92 fL (ref 79–97)
Monocytes Absolute: 0.8 10*3/uL (ref 0.1–0.9)
Monocytes: 10 %
Neutrophils Absolute: 4.8 10*3/uL (ref 1.4–7.0)
Neutrophils: 61 %
Platelets: 368 10*3/uL (ref 150–450)
RBC: 4.39 x10E6/uL (ref 4.14–5.80)
RDW: 12.4 % (ref 11.6–15.4)
WBC: 7.8 10*3/uL (ref 3.4–10.8)

## 2021-05-15 LAB — CMP14+EGFR
ALT: 34 IU/L (ref 0–44)
AST: 16 IU/L (ref 0–40)
Albumin/Globulin Ratio: 1.8 (ref 1.2–2.2)
Albumin: 4.2 g/dL (ref 3.8–4.9)
Alkaline Phosphatase: 75 IU/L (ref 44–121)
BUN/Creatinine Ratio: 13 (ref 9–20)
BUN: 12 mg/dL (ref 6–24)
Bilirubin Total: 0.2 mg/dL (ref 0.0–1.2)
CO2: 22 mmol/L (ref 20–29)
Calcium: 9.3 mg/dL (ref 8.7–10.2)
Chloride: 103 mmol/L (ref 96–106)
Creatinine, Ser: 0.92 mg/dL (ref 0.76–1.27)
Globulin, Total: 2.3 g/dL (ref 1.5–4.5)
Glucose: 80 mg/dL (ref 70–99)
Potassium: 4.7 mmol/L (ref 3.5–5.2)
Sodium: 140 mmol/L (ref 134–144)
Total Protein: 6.5 g/dL (ref 6.0–8.5)
eGFR: 98 mL/min/{1.73_m2} (ref >59)

## 2021-05-15 LAB — LIPID PANEL
Chol/HDL Ratio: 7.1 ratio — ABNORMAL HIGH (ref 0.0–5.0)
Cholesterol, Total: 250 mg/dL — ABNORMAL HIGH (ref 100–199)
HDL: 35 mg/dL — ABNORMAL LOW (ref >39)
LDL Chol Calc (NIH): 134 mg/dL — ABNORMAL HIGH (ref 0–99)
Triglycerides: 442 mg/dL — ABNORMAL HIGH (ref 0–149)
VLDL Cholesterol Cal: 81 mg/dL — ABNORMAL HIGH (ref 5–40)

## 2021-05-15 NOTE — Telephone Encounter (Signed)
Office visit 05/14/21  Sildenafil last refill 09/28/19, #30, 3 refills  Atorvastatin is not on med list nor listed in his office visit notes.  Lipid panel done and is back from yesterday's visit.  Wasn't sure what you wanted to prescribe.

## 2021-05-19 ENCOUNTER — Ambulatory Visit: Payer: BC Managed Care – PPO | Admitting: Family Medicine

## 2021-05-22 MED ORDER — ATORVASTATIN CALCIUM 20 MG PO TABS: 20.0000 mg | ORAL_TABLET | Freq: Every day | ORAL | 3 refills | Status: AC

## 2021-05-22 NOTE — Addendum Note (Signed)
Addended by: Ladean Raya on: 05/22/2021 09:47 AM   Modules accepted: Orders

## 2021-08-31 ENCOUNTER — Telehealth: Payer: Self-pay | Admitting: Family Medicine

## 2021-08-31 NOTE — Telephone Encounter (Signed)
Advised pharmacy to fill the .50mg  until 1mg  back in stock. Will verify with Dr. Warrick Parisian.

## 2021-09-01 ENCOUNTER — Other Ambulatory Visit: Payer: Self-pay | Admitting: Family Medicine

## 2021-09-01 DIAGNOSIS — F411 Generalized anxiety disorder: Secondary | ICD-10-CM

## 2021-09-01 DIAGNOSIS — F3341 Major depressive disorder, recurrent, in partial remission: Secondary | ICD-10-CM

## 2021-09-01 MED ORDER — CLONAZEPAM 0.5 MG PO TABS: 0.5000 mg | ORAL_TABLET | Freq: Two times a day (BID) | ORAL | 1 refills | Status: AC | PRN

## 2021-09-01 NOTE — Progress Notes (Unsigned)
Sent refill for 0.5 mg tablets, make sure they cancel out the 1 mg prescription

## 2021-09-01 NOTE — Progress Notes (Signed)
Pharmacy aware

## 2021-09-02 ENCOUNTER — Encounter: Payer: Self-pay | Admitting: *Deleted

## 2021-10-17 DEATH — deceased

## 2021-11-12 ENCOUNTER — Ambulatory Visit: Payer: BC Managed Care – PPO | Admitting: Family Medicine

## 2021-11-23 ENCOUNTER — Ambulatory Visit: Payer: BC Managed Care – PPO | Admitting: Family Medicine

## 2022-01-06 IMAGING — CT CT HEAD W/O CM
3 series · 15 of 47 positions shown, 18 images · non-contrast
Comparison: None.

CLINICAL DATA: Headaches and fevers

EXAM:
CT HEAD WITHOUT CONTRAST
TECHNIQUE: Contiguous axial images were obtained from the base of the skull
through the vertex without intravenous contrast.

[Series 2: head w o · axial · 0.43mm/px · z∈[+37,+162]mm · 9 of 31 slices shown, 12 images]
[im 3/31  brain]
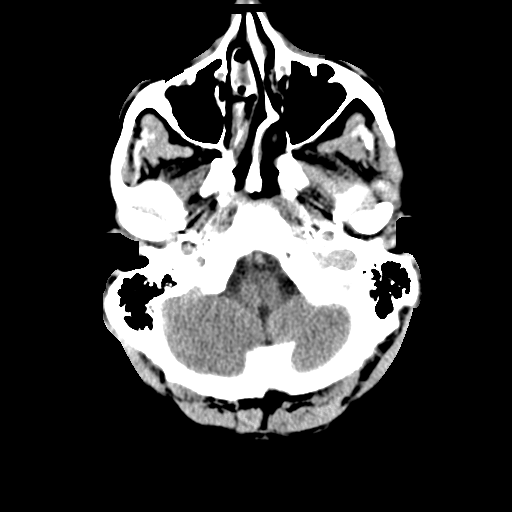
[im 3/31  bone]
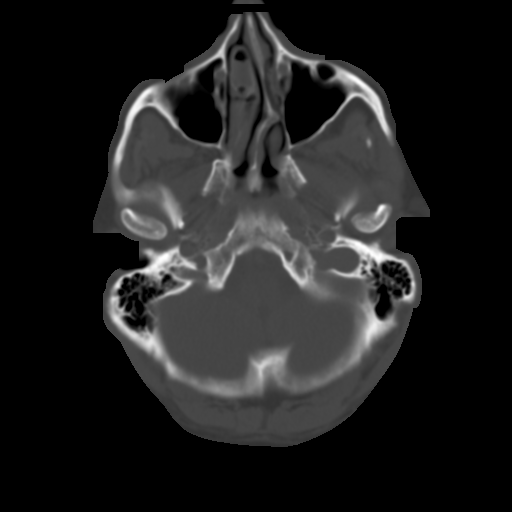
[im 6/31  brain]
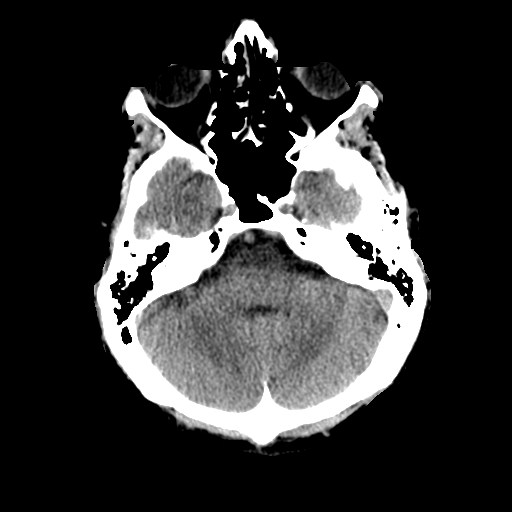
[im 9/31  brain]
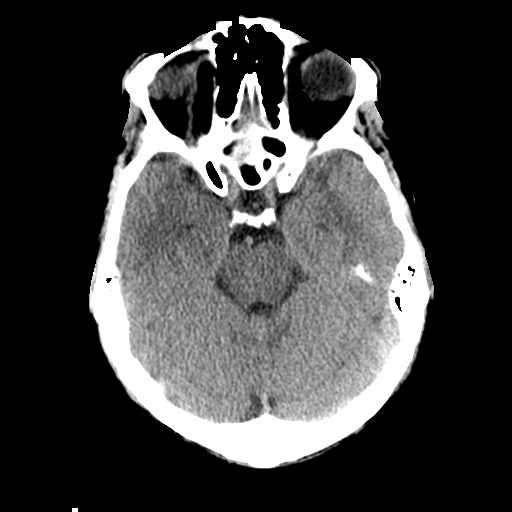
[im 12/31  brain]
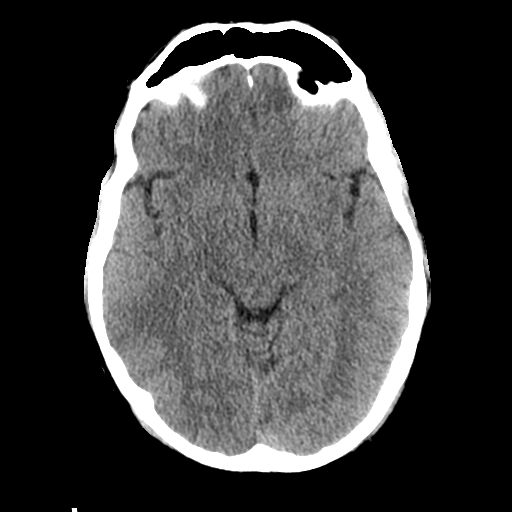
[im 16/31  brain]
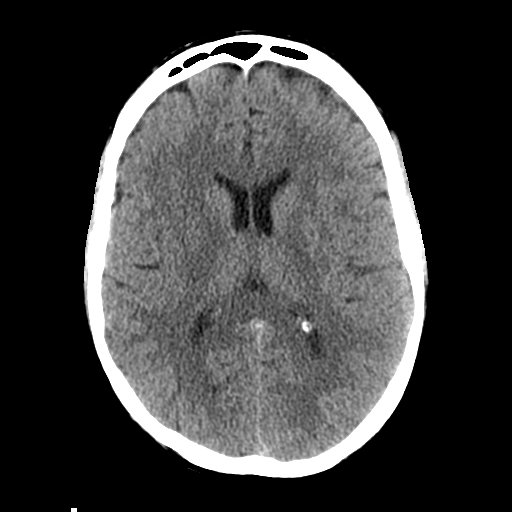
[im 16/31  bone]
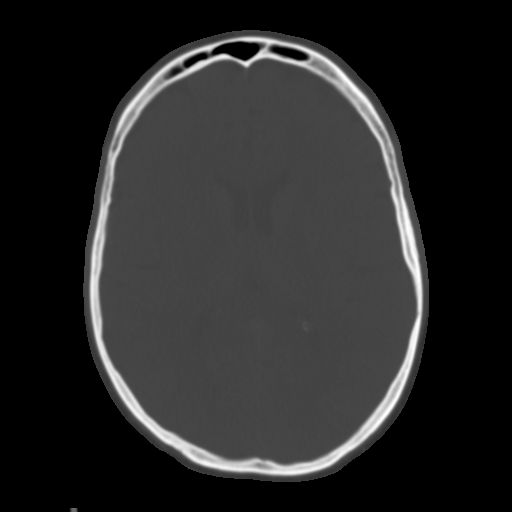
[im 19/31  brain]
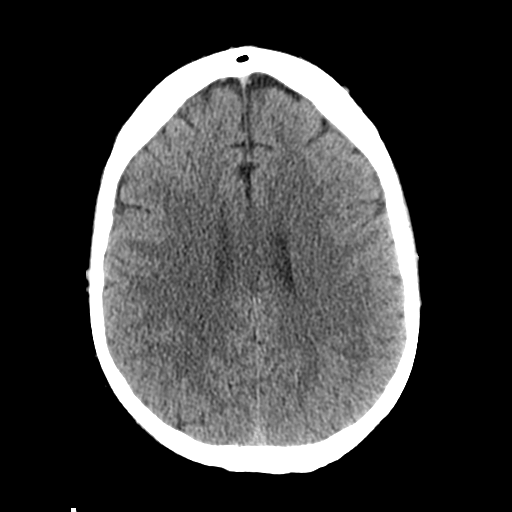
[im 22/31  brain]
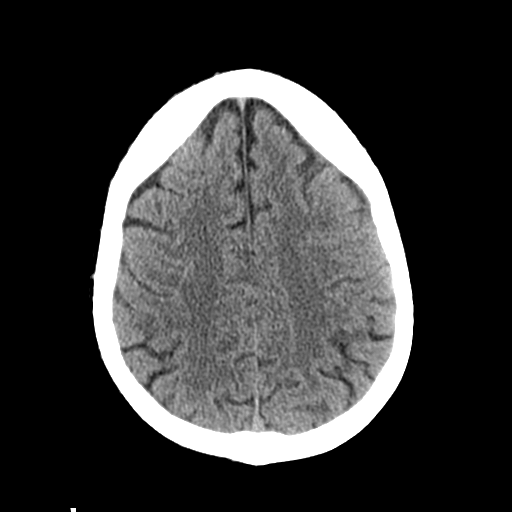
[im 25/31  brain]
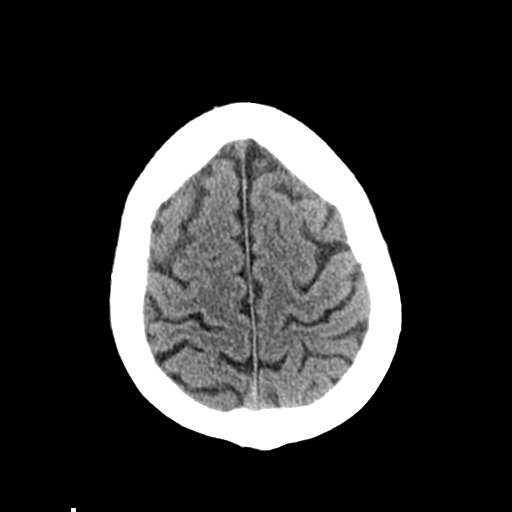
[im 28/31  brain]
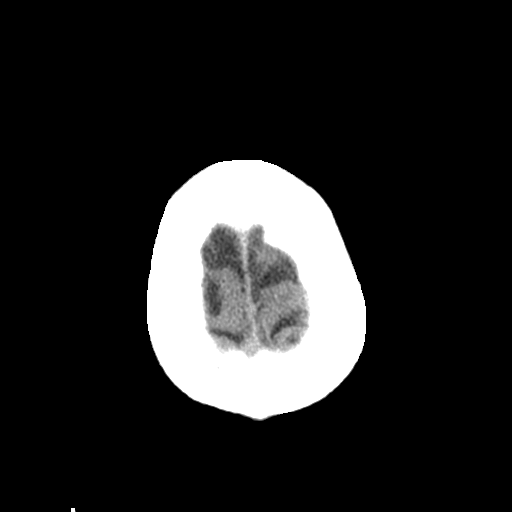
[im 28/31  bone]
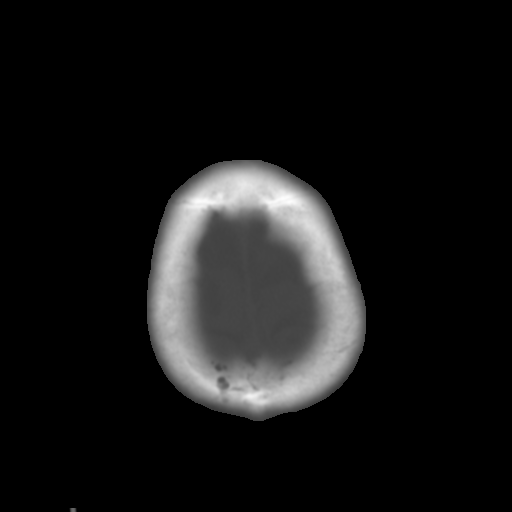

[Series 4: coronal soft · coronal · 0.31mm/px · 3 of 70 slices shown]
[im 24/70  brain]
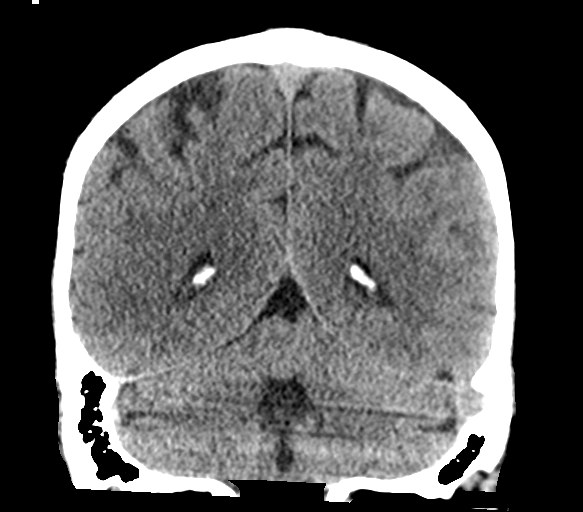
[im 31/70  brain]
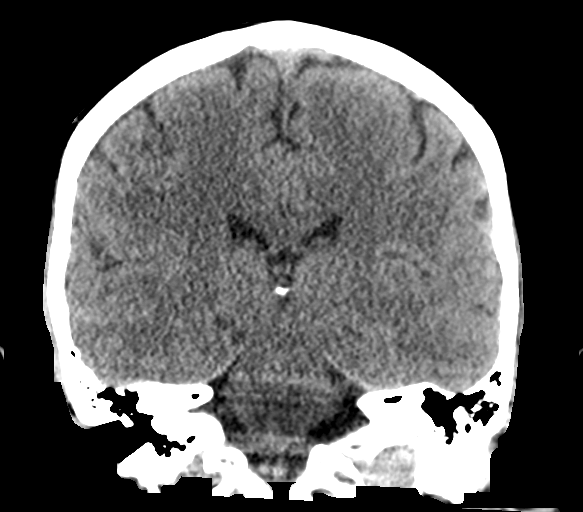
[im 39/70  brain]
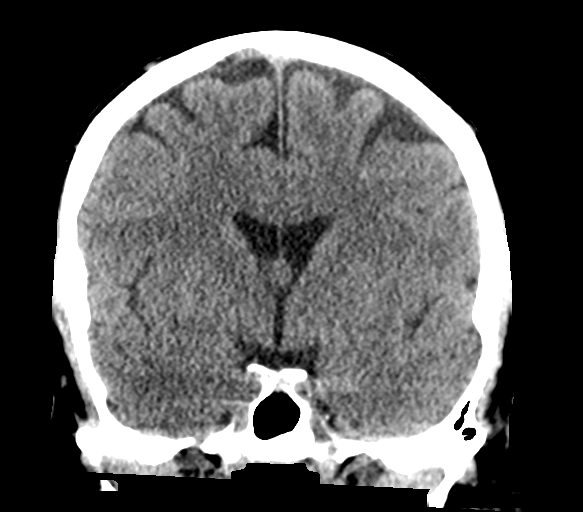

[Series 5: sagittal soft · sagittal · 0.32mm/px · 3 of 54 slices shown]
[im 18/54  brain]
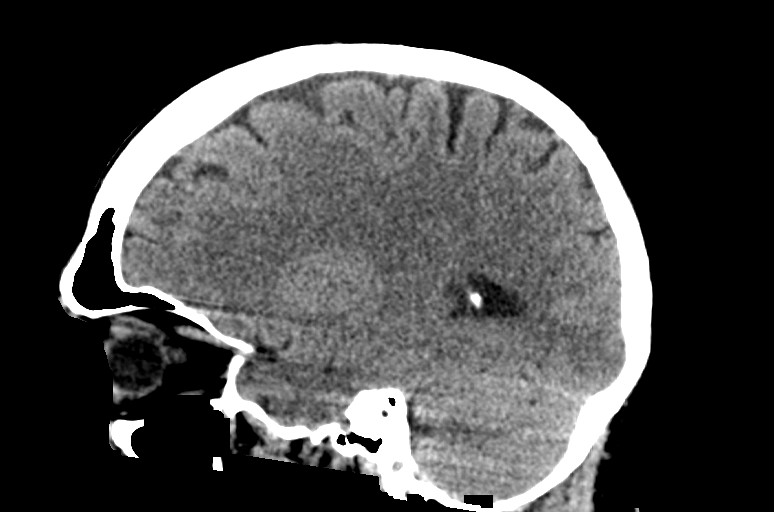
[im 27/54  brain]
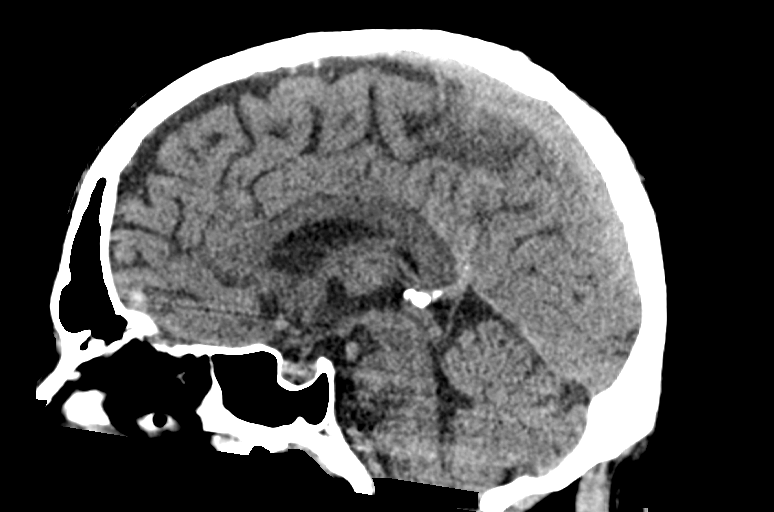
[im 36/54  brain]
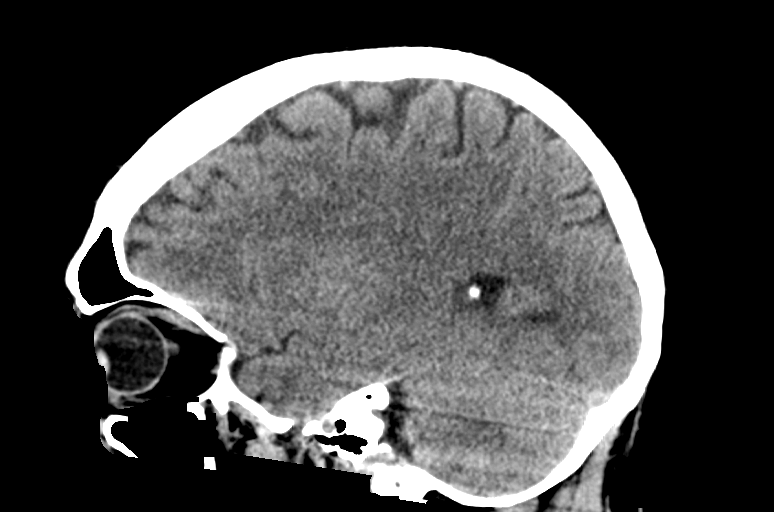

[15 of 47 positions shown; findings below may reference images not displayed]

FINDINGS: Brain: No evidence of acute infarction, hemorrhage, hydrocephalus,
extra-axial collection or mass lesion/mass effect.

Vascular: No hyperdense vessel or unexpected calcification.

Skull: Normal. Negative for fracture or focal lesion.

Sinuses/Orbits: Mild mucosal thickening is noted within the right
maxillary antrum.

Other: None.
IMPRESSION: Mild sinus disease as described. No acute intracranial abnormality
noted.

## 2022-01-06 IMAGING — DX DG CHEST 2V
2 series · 2 of 2 positions shown · non-contrast
Comparison: 06/01/2011, report only.

CLINICAL DATA: Cough

EXAM:
CHEST - 2 VIEW

[chest pa]
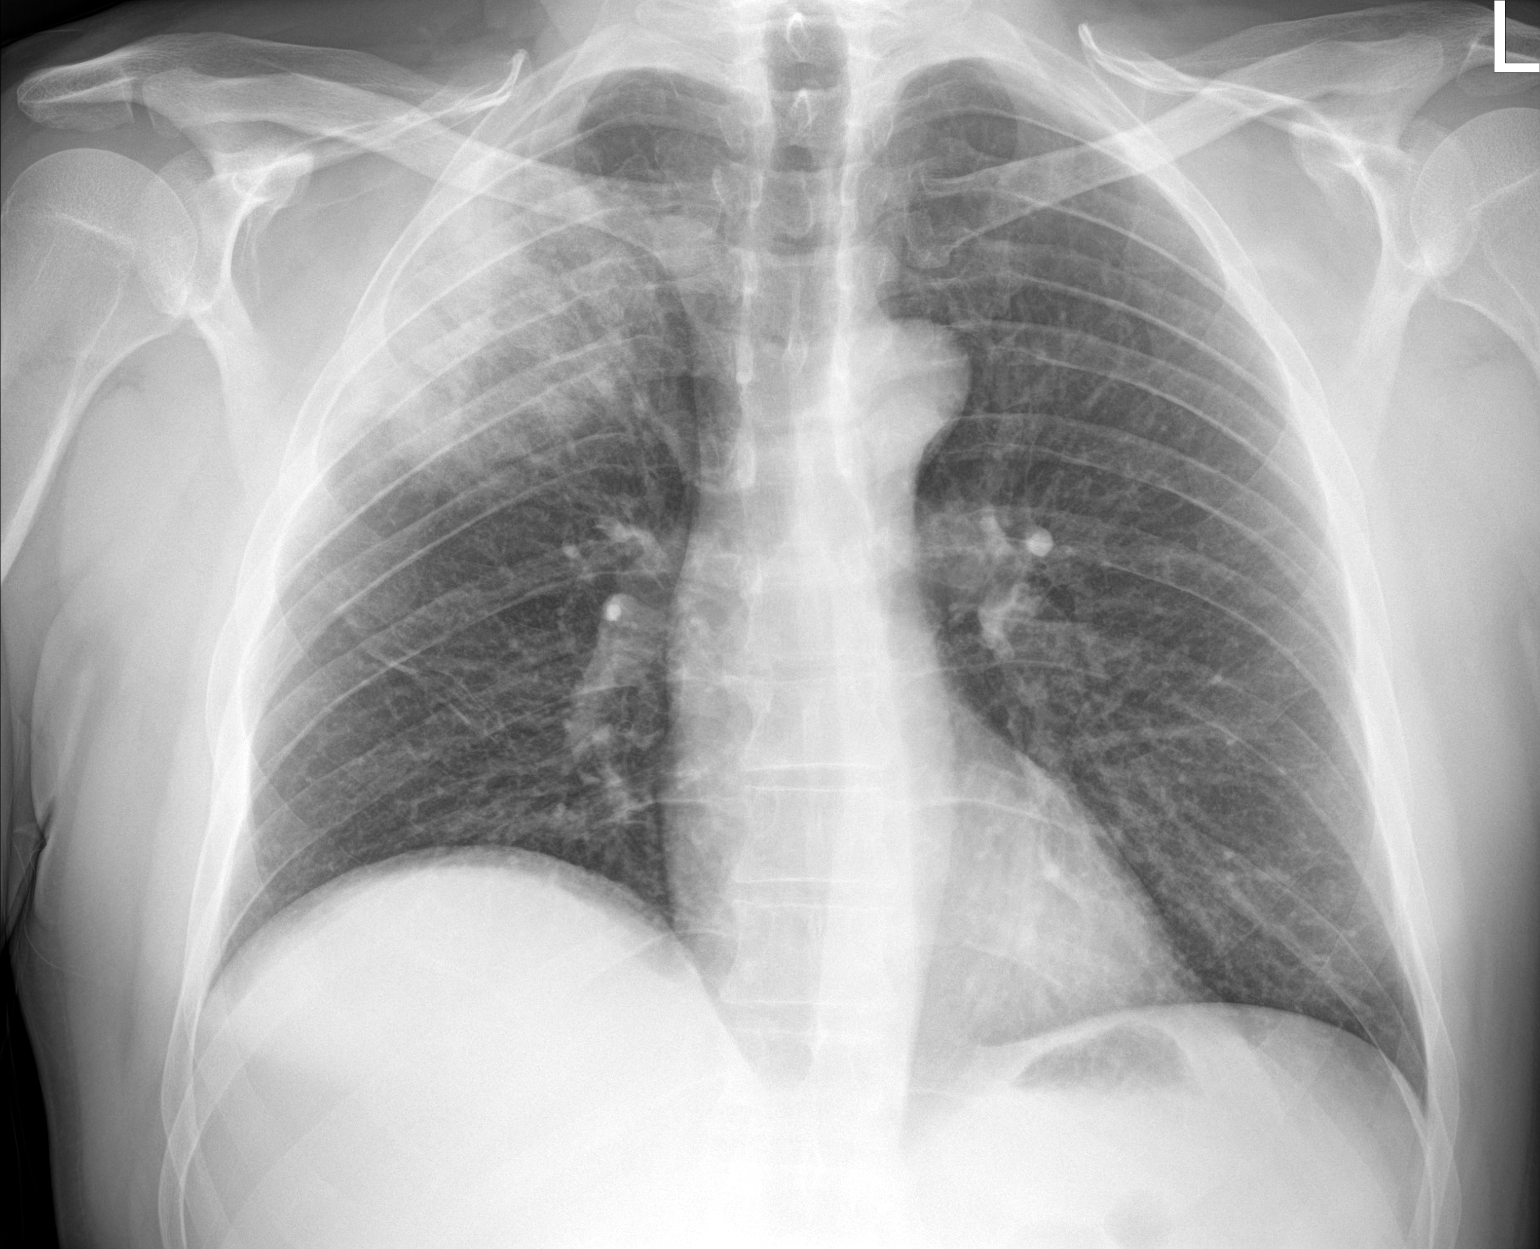

[chest lat]
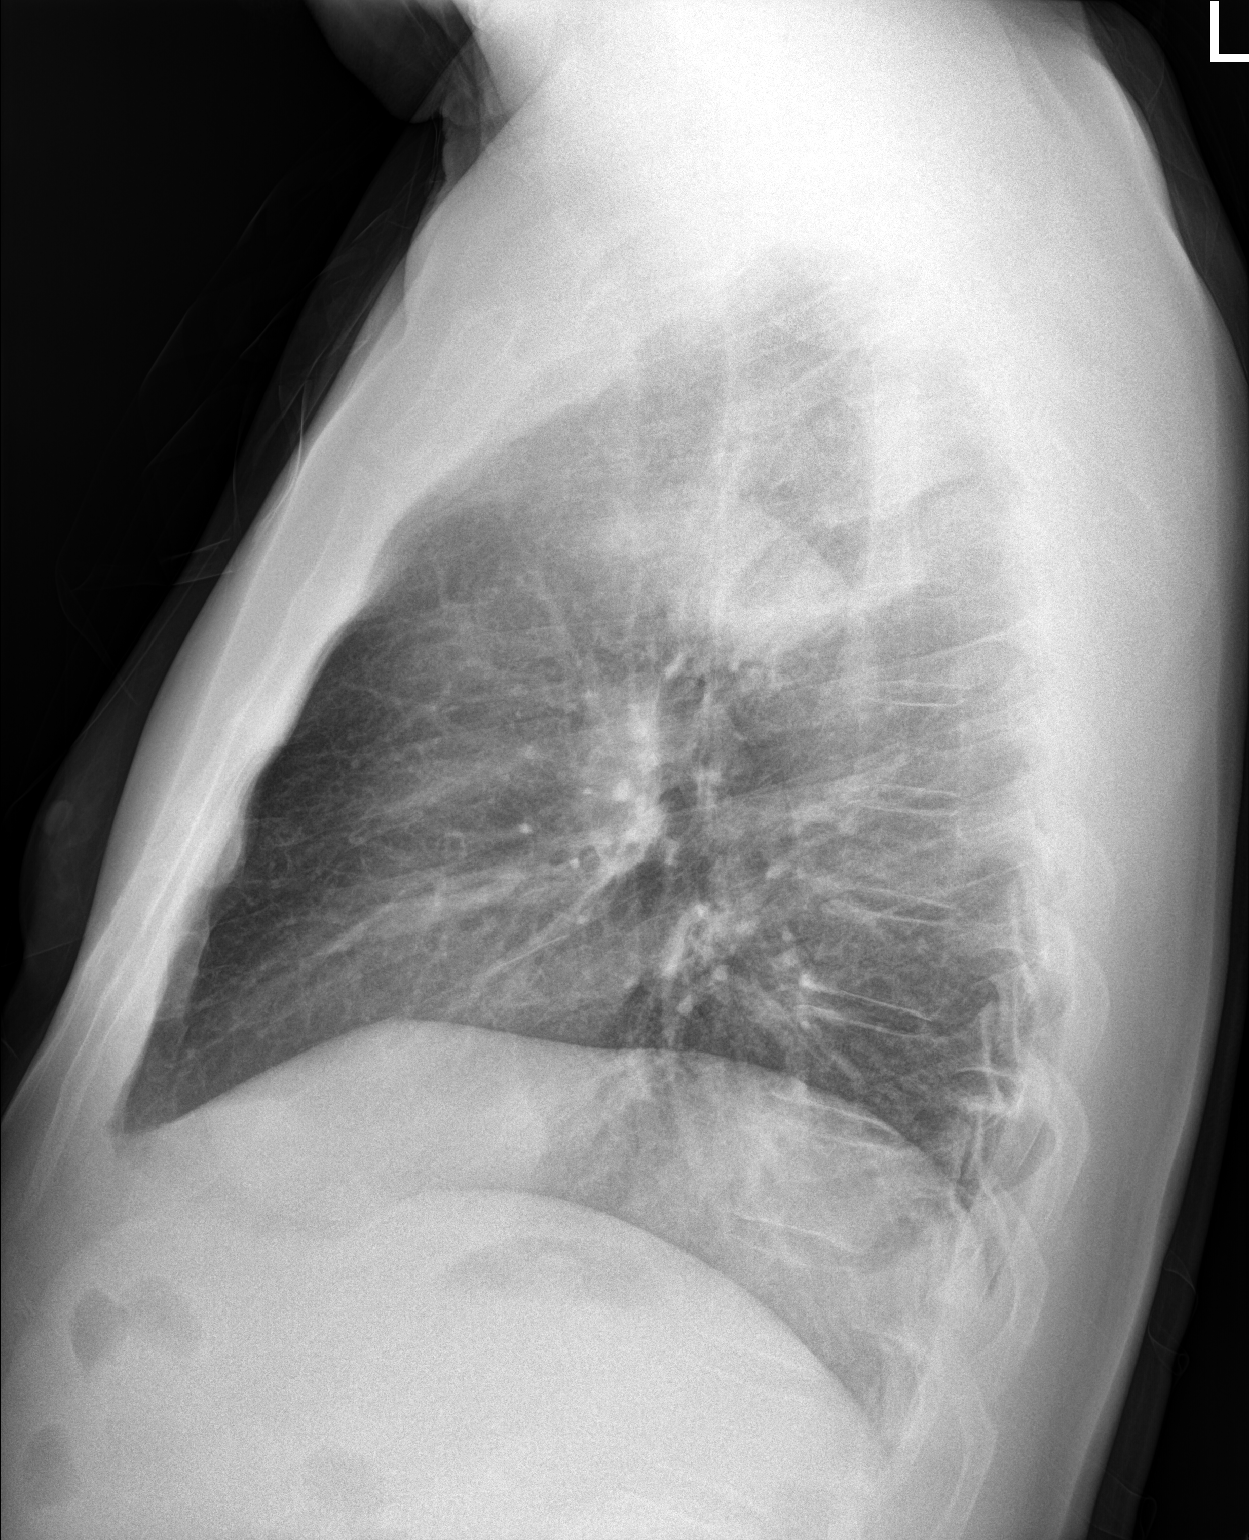

[2 of 2 positions shown; findings below may reference images not displayed]

FINDINGS: Mild right hemidiaphragm elevation. Midline trachea. Normal heart
size and mediastinal contours. No pleural effusion or pneumothorax.
Mild pulmonary interstitial thickening. Right upper lobe
consolidation
IMPRESSION: Right upper lobe consolidation, favoring pneumonia. Followup PA and
lateral chest X-ray is recommended in 3-4 weeks following trial of
antibiotic therapy to ensure resolution and exclude underlying
malignancy.

Peribronchial thickening which may relate to chronic bronchitis or
smoking.
